# Patient Record
Sex: Male | Born: 1975 | Race: White | Marital: Married | Smoking: Former smoker
Health system: Southern US, Community
[De-identification: ages and names within clinical notes are randomized; demographics above are authoritative.]

## PROBLEM LIST (undated history)

## (undated) DIAGNOSIS — M87051 Idiopathic aseptic necrosis of right femur: Secondary | ICD-10-CM

## (undated) DIAGNOSIS — R51 Headache: Secondary | ICD-10-CM

## (undated) DIAGNOSIS — I10 Essential (primary) hypertension: Secondary | ICD-10-CM

## (undated) DIAGNOSIS — Z87442 Personal history of urinary calculi: Secondary | ICD-10-CM

## (undated) DIAGNOSIS — K219 Gastro-esophageal reflux disease without esophagitis: Secondary | ICD-10-CM

---

## 2010-10-24 ENCOUNTER — Encounter (HOSPITAL_COMMUNITY): Payer: Self-pay

## 2010-10-31 DIAGNOSIS — R079 Chest pain, unspecified: Secondary | ICD-10-CM | POA: Insufficient documentation

## 2010-11-03 ENCOUNTER — Encounter (INDEPENDENT_AMBULATORY_CARE_PROVIDER_SITE_OTHER): Payer: PRIVATE HEALTH INSURANCE | Admitting: Physician Assistant

## 2010-11-03 ENCOUNTER — Encounter: Payer: Self-pay | Admitting: Physician Assistant

## 2010-11-03 DIAGNOSIS — R079 Chest pain, unspecified: Secondary | ICD-10-CM

## 2011-09-04 ENCOUNTER — Ambulatory Visit
Admission: RE | Admit: 2011-09-04 | Discharge: 2011-09-04 | Disposition: A | Discharge status: Home / Self Care | Payer: PRIVATE HEALTH INSURANCE | Source: Ambulatory Visit | Attending: Internal Medicine | Admitting: Internal Medicine

## 2011-09-04 ENCOUNTER — Other Ambulatory Visit: Payer: Self-pay | Admitting: Internal Medicine

## 2011-09-04 DIAGNOSIS — R52 Pain, unspecified: Secondary | ICD-10-CM

## 2012-01-16 NOTE — H&P (Signed)
Raymond Hernandez DOB: 1975-11-14  Chief Complaint: right hip pain  History of Present Illness The patient is a 36 year old male who comes in today for a preoperative History and Physical. The patient is scheduled for a right total hip arthroplasty to be performed by Dr. Georges Lynch. Darrelyn Hillock, MD at [facility] on Monday Feb 05, 2012 . The patient has been seen by Dr. Darrelyn Hillock for his right hip pain since February of 2013. He reports that his symptoms have been present since September 2012. He reports pain in the right groin that radiates to the right knee. He is finding it increasingly difficult to do daily activities as it is extremely painful when weightbearing. MRI showed avascular necrosis of the femoral head of the right hip. Most predictable means for decreased pain and increased function in the right hip is a right total hip arthroplasty. Risks and benefits of the surgery discussed.  PCP: Dr. 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 1994 Hypertension  Allergies No Known Drug Allergies. 11/07/2011   Family History( Heart Disease. father, grandfather mothers side and grandfather fathers side Cerebrovascular Accident. grandmother mothers side Father. living age 22; history of MI, 2 stents Mother. living age 72; hypercholesteremia, HTN, asthma Diabetes Mellitus Type II Alzheimer's disease   Social History Marital status. 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 greater than 5 Exercise. Exercises daily; does running / walking Illicit drug use. no Living situation. live with spouse Tobacco use. smoke(d) 3 or more pack(s) per day; uses less than half 1/2 can(s) smokeless per week Pain Contract. no Previously in rehab. no Tobacco / smoke exposure.  no Drug/Alcohol Rehab (Currently). no Children. 1 Current work status. working full time Alcohol use. current drinker; drinks beer and wine; 5-7 per week Post-Surgical Plans. caregiver after surgery- wife, parents Advance Directives. none   Medication History Lisinopril (40MG  Tablet, Oral daily) Active. ProAir HFA (108 (90 Base)MCG/ACT Aerosol Soln, Inhalation as needed) Active. One-A-Day Mens (1 Oral daily) Active.   Pregnancy / Birth History Pregnant. no   Past Surgical History No pertinent past surgical history    Review of Systems General:Not Present- Chills, Fever, Night Sweats, Appetite Loss, Fatigue, Feeling sick, Weight Gain and Weight Loss. Skin:Not Present- Itching, Rash, Skin Color Changes, Ulcer, Psoriasis and Change in Hair or Nails. HEENT:Not Present- Sensitivity to light, Nose Bleed, Visual Loss, Decreased Hearing and Ringing in the Ears. Neck:Not Present- Swollen Glands and Neck Mass. Respiratory:Present- Shortness of breath with exertion. Not Present- Shortness of breath, Snoring, Chronic Cough and Bloody sputum. Cardiovascular:Not Present- Shortness of Breath, Chest Pain, Swelling of Extremities, Leg Cramps and Palpitations. Gastrointestinal:Not Present- Bloody Stool, Heartburn, Abdominal Pain, Vomiting, Nausea and Incontinence of Stool. Male Genitourinary:Not Present- Blood in Urine, Frequency, Incontinence and Nocturia. Musculoskeletal:Present- Muscle Pain, Joint Stiffness, Joint Swelling and Joint Pain. Not Present- Muscle Weakness and Back Pain. Neurological:Present- Dizziness (due to orthostatic hypotension). Not Present- Tingling, Numbness, Burning, Tremor and Headaches. Psychiatric:Not Present- Anxiety, Depression and Memory Loss. Endocrine:Not Present- Cold Intolerance, Heat Intolerance, Excessive hunger and Excessive Thirst. Hematology:Not Present- Abnormal Bleeding, Abnormal bruising, Anemia and Blood  Clots.   Vitals Weight: 179 lb Height: 69.25 in Body Surface Area: 1.99 m Body Mass Index: 26.24 kg/m Pulse: 98 (Regular) Resp.: 17 (Unlabored) BP: 138/90 (Sitting, Right Arm, Standard)    Physical Exam General Mental Status - Alert, cooperative and  good historian. General Appearance- pleasant. Not in acute distress. Orientation- Oriented X3. Build & Nutrition- Well nourished and Well developed. Head and Neck Head- normocephalic, atraumatic . Neck Global Assessment- supple. no bruit auscultated on the right and no bruit auscultated on the left. Eye Pupil- Bilateral- Normal. Motion- Bilateral- EOMI. Chest and Lung Exam Auscultation: Breath sounds:- clear at anterior chest wall and - clear at posterior chest wall. Adventitious sounds:- No Adventitious sounds. Cardiovascular Auscultation:Rhythm- Regular and Tachycardic. Heart Sounds- S1 WNL and S2 WNL. Murmurs & Other Heart Sounds:Auscultation of the heart reveals - No Murmurs. Abdomen Palpation/Percussion:Tenderness- Abdomen is non-tender to palpation. Rigidity (guarding)- Abdomen is soft. Auscultation:Auscultation of the abdomen reveals - Bowel sounds normal. Male Genitourinary Not done, not pertinent to present illness Peripheral Vascular Upper Extremity: Palpation:- Pulses bilaterally normal. Lower Extremity: Palpation:- Pulses bilaterally normal. Neurologic Examination of related systems reveals - normal muscle strength and tone in all extremities. Neurologic evaluation reveals - normal sensation and upper and lower extremity deep tendon reflexes intact bilaterally . Musculoskeletal His examination shows he has a marked antalgic gait on the right. On examination of his right hip he has a painful limited motion of the hip in all planes, especially with internal rotation. He does have pain with motion. The knee is negative. The opposite left hip is  normal.    RADIOGRAPHS: X-rays of his hip shows an avascular necrosis of the right femoral head. It looks as though he has some separation of the articular cartilage from the femoral head.  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

## 2012-01-23 ENCOUNTER — Encounter (HOSPITAL_COMMUNITY): Payer: Self-pay | Admitting: Pharmacy Technician

## 2012-01-29 ENCOUNTER — Ambulatory Visit (HOSPITAL_COMMUNITY)
Admission: RE | Admit: 2012-01-29 | Discharge: 2012-01-29 | Disposition: A | Discharge status: Home / Self Care | Payer: PRIVATE HEALTH INSURANCE | Source: Ambulatory Visit | Attending: Orthopedic Surgery | Admitting: Orthopedic Surgery

## 2012-01-29 ENCOUNTER — Encounter (HOSPITAL_COMMUNITY): Payer: Self-pay

## 2012-01-29 ENCOUNTER — Encounter (HOSPITAL_COMMUNITY)
Admission: RE | Admit: 2012-01-29 | Discharge: 2012-01-29 | Disposition: A | Discharge status: Home / Self Care | Payer: PRIVATE HEALTH INSURANCE | Source: Ambulatory Visit | Attending: Orthopedic Surgery | Admitting: Orthopedic Surgery

## 2012-01-29 DIAGNOSIS — I4510 Unspecified right bundle-branch block: Secondary | ICD-10-CM | POA: Insufficient documentation

## 2012-01-29 DIAGNOSIS — X58XXXA Exposure to other specified factors, initial encounter: Secondary | ICD-10-CM | POA: Insufficient documentation

## 2012-01-29 DIAGNOSIS — Z01818 Encounter for other preprocedural examination: Secondary | ICD-10-CM | POA: Insufficient documentation

## 2012-01-29 DIAGNOSIS — S32009A Unspecified fracture of unspecified lumbar vertebra, initial encounter for closed fracture: Secondary | ICD-10-CM | POA: Insufficient documentation

## 2012-01-29 DIAGNOSIS — M8708 Idiopathic aseptic necrosis of bone, other site: Secondary | ICD-10-CM | POA: Insufficient documentation

## 2012-01-29 DIAGNOSIS — Z01810 Encounter for preprocedural cardiovascular examination: Secondary | ICD-10-CM | POA: Insufficient documentation

## 2012-01-29 DIAGNOSIS — J984 Other disorders of lung: Secondary | ICD-10-CM | POA: Insufficient documentation

## 2012-01-29 HISTORY — DX: Idiopathic aseptic necrosis of right femur: M87.051

## 2012-01-29 HISTORY — DX: Headache: R51

## 2012-01-29 HISTORY — DX: Essential (primary) hypertension: I10

## 2012-01-29 HISTORY — DX: Personal history of urinary calculi: Z87.442

## 2012-01-29 LAB — CBC
HCT: 44.8 % (ref 39.0–52.0)
Hemoglobin: 15.6 g/dL (ref 13.0–17.0)
MCH: 30.7 pg (ref 26.0–34.0)
MCHC: 34.8 g/dL (ref 30.0–36.0)
MCV: 88.2 fL (ref 78.0–100.0)
Platelets: 291 10*3/uL (ref 150–400)
RBC: 5.08 MIL/uL (ref 4.22–5.81)
RDW: 12.3 % (ref 11.5–15.5)
WBC: 10.1 10*3/uL (ref 4.0–10.5)

## 2012-01-29 LAB — COMPREHENSIVE METABOLIC PANEL
ALT: 22 U/L (ref 0–53)
AST: 17 U/L (ref 0–37)
Albumin: 4.2 g/dL (ref 3.5–5.2)
Alkaline Phosphatase: 69 U/L (ref 39–117)
BUN: 11 mg/dL (ref 6–23)
CO2: 27 mEq/L (ref 19–32)
Calcium: 9.3 mg/dL (ref 8.4–10.5)
Chloride: 100 mEq/L (ref 96–112)
Creatinine, Ser: 0.93 mg/dL (ref 0.50–1.35)
GFR calc Af Amer: >90 mL/min (ref >90)
GFR calc non Af Amer: >90 mL/min (ref >90)
Glucose, Bld: 100 mg/dL — ABNORMAL HIGH (ref 70–99)
Potassium: 3.5 mEq/L (ref 3.5–5.1)
Sodium: 136 mEq/L (ref 135–145)
Total Bilirubin: 0.5 mg/dL (ref 0.3–1.2)
Total Protein: 7.9 g/dL (ref 6.0–8.3)

## 2012-01-29 LAB — DIFFERENTIAL
Basophils Absolute: 0 10*3/uL (ref 0.0–0.1)
Basophils Relative: 0 % (ref 0–1)
Eosinophils Absolute: 0.1 10*3/uL (ref 0.0–0.7)
Eosinophils Relative: 1 % (ref 0–5)
Lymphocytes Relative: 17 % (ref 12–46)
Lymphs Abs: 1.7 10*3/uL (ref 0.7–4.0)
Monocytes Absolute: 0.6 10*3/uL (ref 0.1–1.0)
Monocytes Relative: 6 % (ref 3–12)
Neutro Abs: 7.7 10*3/uL (ref 1.7–7.7)
Neutrophils Relative %: 76 % (ref 43–77)

## 2012-01-29 LAB — URINALYSIS, ROUTINE W REFLEX MICROSCOPIC
Bilirubin Urine: NEGATIVE
Glucose, UA: NEGATIVE mg/dL
Hgb urine dipstick: NEGATIVE
Ketones, ur: NEGATIVE mg/dL
Leukocytes, UA: NEGATIVE
Nitrite: NEGATIVE
Protein, ur: NEGATIVE mg/dL
Specific Gravity, Urine: 1.018 (ref 1.005–1.030)
Urobilinogen, UA: 0.2 mg/dL (ref 0.0–1.0)
pH: 6.5 (ref 5.0–8.0)

## 2012-01-29 LAB — SURGICAL PCR SCREEN
MRSA, PCR: NEGATIVE
Staphylococcus aureus: NEGATIVE

## 2012-01-29 LAB — PROTIME-INR
INR: 0.95 (ref 0.00–1.49)
Prothrombin Time: 12.9 seconds (ref 11.6–15.2)

## 2012-01-29 LAB — APTT: aPTT: 29 seconds (ref 24–37)

## 2012-01-29 NOTE — Patient Instructions (Addendum)
20 Raymond Hernandez  01/29/2012   Your procedure is scheduled on:  02/05/12  Report to SHORT STAY DEPT  at 5:15 AM.  Call this number if you have problems the morning of surgery: [phone]   Remember:   Do not eat food or drink liquids AFTER MIDNIGHT     Take these medicines the morning of surgery with A SIP OF WATER: NONE   Do not wear jewelry, make-up or nail polish.  Do not wear lotions, powders, or perfumes.   Do not shave legs or underarms 48 hrs. before surgery (men may shave face)  Do not bring valuables to the hospital.  Contacts, dentures or bridgework may not be worn into surgery.  Leave suitcase in the car. After surgery it may be brought to your room.  For patients admitted to the hospital, checkout time is 11:00 AM the day of discharge.   Patients discharged the day of surgery will not be allowed to drive home.    Special Instructions:   Please read over the following fact sheets that you were given: MRSA  Information / Incentive Spirometer               SHOWER WITH BETASEPT THE NIGHT BEFORE SURGERY AND THE MORNING OF SURGERY

## 2012-01-29 NOTE — Progress Notes (Signed)
01/29/12 1537  OBSTRUCTIVE SLEEP APNEA  Have you ever been diagnosed with sleep apnea through a sleep study? No  Do you snore loudly (loud enough to be heard through closed doors)?  1  Do you often feel tired, fatigued, or sleepy during the daytime? 1  Has anyone observed you stop breathing during your sleep? 0  Do you have, or are you being treated for high blood pressure? 1  BMI more than 35 kg/m2? 0  Age over 36 years old? 0  Neck circumference greater than 40 cm/18 inches? 0  Gender: 1  Obstructive Sleep Apnea Score 4   Score 4 or greater  Updated health history;Results sent to PCP

## 2012-02-05 ENCOUNTER — Ambulatory Visit (HOSPITAL_COMMUNITY): Payer: PRIVATE HEALTH INSURANCE

## 2012-02-05 ENCOUNTER — Encounter (HOSPITAL_COMMUNITY): Payer: Self-pay | Admitting: Anesthesiology

## 2012-02-05 ENCOUNTER — Encounter (HOSPITAL_COMMUNITY)
Admission: RE | Disposition: A | Discharge status: Home Health | Payer: Self-pay | Source: Ambulatory Visit | Attending: Orthopedic Surgery

## 2012-02-05 ENCOUNTER — Ambulatory Visit (HOSPITAL_COMMUNITY): Payer: PRIVATE HEALTH INSURANCE | Admitting: Anesthesiology

## 2012-02-05 ENCOUNTER — Encounter (HOSPITAL_COMMUNITY): Payer: Self-pay | Admitting: *Deleted

## 2012-02-05 ENCOUNTER — Inpatient Hospital Stay (HOSPITAL_COMMUNITY)
Admission: RE | Admit: 2012-02-05 | Discharge: 2012-02-08 | DRG: 470 | Disposition: A | Discharge status: Home Health | Payer: PRIVATE HEALTH INSURANCE | Source: Ambulatory Visit | Attending: Orthopedic Surgery | Admitting: Orthopedic Surgery

## 2012-02-05 DIAGNOSIS — K219 Gastro-esophageal reflux disease without esophagitis: Secondary | ICD-10-CM | POA: Diagnosis present

## 2012-02-05 DIAGNOSIS — M87059 Idiopathic aseptic necrosis of unspecified femur: Principal | ICD-10-CM | POA: Diagnosis present

## 2012-02-05 DIAGNOSIS — Z8249 Family history of ischemic heart disease and other diseases of the circulatory system: Secondary | ICD-10-CM

## 2012-02-05 DIAGNOSIS — R269 Unspecified abnormalities of gait and mobility: Secondary | ICD-10-CM | POA: Diagnosis present

## 2012-02-05 DIAGNOSIS — I10 Essential (primary) hypertension: Secondary | ICD-10-CM | POA: Diagnosis present

## 2012-02-05 DIAGNOSIS — Z823 Family history of stroke: Secondary | ICD-10-CM

## 2012-02-05 DIAGNOSIS — Z01812 Encounter for preprocedural laboratory examination: Secondary | ICD-10-CM

## 2012-02-05 DIAGNOSIS — Z87442 Personal history of urinary calculi: Secondary | ICD-10-CM

## 2012-02-05 DIAGNOSIS — G4730 Sleep apnea, unspecified: Secondary | ICD-10-CM | POA: Diagnosis present

## 2012-02-05 DIAGNOSIS — J45990 Exercise induced bronchospasm: Secondary | ICD-10-CM | POA: Diagnosis present

## 2012-02-05 DIAGNOSIS — M87051 Idiopathic aseptic necrosis of right femur: Secondary | ICD-10-CM | POA: Diagnosis present

## 2012-02-05 DIAGNOSIS — M8970 Major osseous defect, unspecified site: Secondary | ICD-10-CM | POA: Diagnosis present

## 2012-02-05 HISTORY — PX: TOTAL HIP ARTHROPLASTY: SHX124

## 2012-02-05 LAB — TYPE AND SCREEN
ABO/RH(D): A POS
Antibody Screen: NEGATIVE

## 2012-02-05 SURGERY — ARTHROPLASTY, HIP, TOTAL,POSTERIOR APPROACH
Anesthesia: General | Site: Hip | Laterality: Right | Wound class: Clean

## 2012-02-05 MED ORDER — MIDAZOLAM HCL 5 MG/5ML IJ SOLN
INTRAMUSCULAR | Status: DC | PRN
  Administered 2012-02-05: 2 mg via INTRAVENOUS

## 2012-02-05 MED ORDER — SODIUM CHLORIDE 0.9 % IR SOLN
Status: DC | PRN
  Administered 2012-02-05: 09:00:00

## 2012-02-05 MED ORDER — ONDANSETRON HCL 4 MG PO TABS: 4.0000 mg | ORAL_TABLET | Freq: Four times a day (QID) | ORAL | Status: DC | PRN

## 2012-02-05 MED ORDER — BUPIVACAINE LIPOSOME 1.3 % IJ SUSP
20.0000 mL | Freq: Once | INTRAMUSCULAR | Status: AC
  Administered 2012-02-05: 20 mL
  Filled 2012-02-05: qty 20

## 2012-02-05 MED ORDER — POLYETHYLENE GLYCOL 3350 17 G PO PACK
17.0000 g | PACK | Freq: Every day | ORAL | Status: DC | PRN
  Administered 2012-02-08: 17 g via ORAL

## 2012-02-05 MED ORDER — ACETAMINOPHEN 10 MG/ML IV SOLN
INTRAVENOUS | Status: DC | PRN
  Administered 2012-02-05: 1000 mg via INTRAVENOUS

## 2012-02-05 MED ORDER — MENTHOL 3 MG MT LOZG
1.0000 | LOZENGE | OROMUCOSAL | Status: DC | PRN
  Filled 2012-02-05: qty 9

## 2012-02-05 MED ORDER — ALBUTEROL SULFATE HFA 108 (90 BASE) MCG/ACT IN AERS: 2.0000 | INHALATION_SPRAY | Freq: Four times a day (QID) | RESPIRATORY_TRACT | Status: DC | PRN

## 2012-02-05 MED ORDER — ONDANSETRON HCL 4 MG/2ML IJ SOLN
4.0000 mg | Freq: Four times a day (QID) | INTRAMUSCULAR | Status: DC | PRN
  Filled 2012-02-05: qty 2

## 2012-02-05 MED ORDER — METHOCARBAMOL 500 MG PO TABS
500.0000 mg | ORAL_TABLET | Freq: Four times a day (QID) | ORAL | Status: DC | PRN
  Administered 2012-02-05 – 2012-02-08 (×6): 500 mg via ORAL
  Filled 2012-02-05 (×6): qty 1

## 2012-02-05 MED ORDER — FLEET ENEMA 7-19 GM/118ML RE ENEM: 1.0000 | ENEMA | Freq: Once | RECTAL | Status: AC | PRN

## 2012-02-05 MED ORDER — CEFAZOLIN SODIUM-DEXTROSE 2-3 GM-% IV SOLR
INTRAVENOUS | Status: AC
  Filled 2012-02-05: qty 50

## 2012-02-05 MED ORDER — THROMBIN 5000 UNITS EX SOLR
CUTANEOUS | Status: DC | PRN
  Administered 2012-02-05: 10000 [IU] via TOPICAL

## 2012-02-05 MED ORDER — CEFAZOLIN SODIUM 1-5 GM-% IV SOLN
1.0000 g | Freq: Four times a day (QID) | INTRAVENOUS | Status: AC
  Administered 2012-02-05 – 2012-02-06 (×3): 1 g via INTRAVENOUS
  Filled 2012-02-05 (×3): qty 50

## 2012-02-05 MED ORDER — METHOCARBAMOL 100 MG/ML IJ SOLN
500.0000 mg | Freq: Four times a day (QID) | INTRAVENOUS | Status: DC | PRN
  Administered 2012-02-05 – 2012-02-06 (×3): 500 mg via INTRAVENOUS
  Filled 2012-02-05 (×3): qty 5

## 2012-02-05 MED ORDER — CEFAZOLIN SODIUM 1-5 GM-% IV SOLN
1.0000 g | INTRAVENOUS | Status: AC
  Administered 2012-02-05: 2 g via INTRAVENOUS

## 2012-02-05 MED ORDER — LACTATED RINGERS IV SOLN
INTRAVENOUS | Status: DC
  Administered 2012-02-05 (×2): via INTRAVENOUS

## 2012-02-05 MED ORDER — BACITRACIN-NEOMYCIN-POLYMYXIN 400-5-5000 EX OINT
TOPICAL_OINTMENT | CUTANEOUS | Status: AC
  Filled 2012-02-05: qty 1

## 2012-02-05 MED ORDER — HYDROMORPHONE HCL PF 1 MG/ML IJ SOLN
0.2500 mg | INTRAMUSCULAR | Status: DC | PRN
  Administered 2012-02-05 (×2): 0.5 mg via INTRAVENOUS

## 2012-02-05 MED ORDER — OXYCODONE-ACETAMINOPHEN 5-325 MG PO TABS: 1.0000 | ORAL_TABLET | ORAL | Status: DC | PRN

## 2012-02-05 MED ORDER — ONDANSETRON HCL 4 MG/2ML IJ SOLN
INTRAMUSCULAR | Status: DC | PRN
  Administered 2012-02-05: 4 mg via INTRAVENOUS

## 2012-02-05 MED ORDER — FENTANYL CITRATE 0.05 MG/ML IJ SOLN
INTRAMUSCULAR | Status: DC | PRN
  Administered 2012-02-05 (×2): 50 ug via INTRAVENOUS
  Administered 2012-02-05: 100 ug via INTRAVENOUS
  Administered 2012-02-05: 25 ug via INTRAVENOUS

## 2012-02-05 MED ORDER — ACETAMINOPHEN 325 MG PO TABS: 650.0000 mg | ORAL_TABLET | Freq: Four times a day (QID) | ORAL | Status: DC | PRN

## 2012-02-05 MED ORDER — LISINOPRIL 40 MG PO TABS
40.0000 mg | ORAL_TABLET | Freq: Every day | ORAL | Status: DC
  Filled 2012-02-05: qty 1

## 2012-02-05 MED ORDER — LACTATED RINGERS IV SOLN
INTRAVENOUS | Status: DC
  Administered 2012-02-05 – 2012-02-06 (×4): via INTRAVENOUS

## 2012-02-05 MED ORDER — RIVAROXABAN 10 MG PO TABS
10.0000 mg | ORAL_TABLET | Freq: Every day | ORAL | Status: DC
  Administered 2012-02-06 – 2012-02-08 (×3): 10 mg via ORAL
  Filled 2012-02-05 (×4): qty 1

## 2012-02-05 MED ORDER — HYDROCODONE-ACETAMINOPHEN 5-325 MG PO TABS
1.0000 | ORAL_TABLET | ORAL | Status: DC | PRN
  Administered 2012-02-05 – 2012-02-06 (×4): 2 via ORAL
  Administered 2012-02-06 – 2012-02-08 (×11): 1 via ORAL
  Filled 2012-02-05: qty 1
  Filled 2012-02-05: qty 2
  Filled 2012-02-05 (×6): qty 1
  Filled 2012-02-05 (×2): qty 2
  Filled 2012-02-05: qty 1
  Filled 2012-02-05: qty 2
  Filled 2012-02-05 (×3): qty 1

## 2012-02-05 MED ORDER — BISACODYL 10 MG RE SUPP: 10.0000 mg | Freq: Every day | RECTAL | Status: DC | PRN

## 2012-02-05 MED ORDER — ACETAMINOPHEN 650 MG RE SUPP: 650.0000 mg | Freq: Four times a day (QID) | RECTAL | Status: DC | PRN

## 2012-02-05 MED ORDER — PHENOL 1.4 % MT LIQD: 1.0000 | OROMUCOSAL | Status: DC | PRN

## 2012-02-05 MED ORDER — ALUM & MAG HYDROXIDE-SIMETH 200-200-20 MG/5ML PO SUSP: 30.0000 mL | ORAL | Status: DC | PRN

## 2012-02-05 MED ORDER — LISINOPRIL 40 MG PO TABS
40.0000 mg | ORAL_TABLET | Freq: Every day | ORAL | Status: DC
  Filled 2012-02-05 (×3): qty 1

## 2012-02-05 MED ORDER — FERROUS SULFATE 325 (65 FE) MG PO TABS
325.0000 mg | ORAL_TABLET | Freq: Three times a day (TID) | ORAL | Status: DC
  Administered 2012-02-05 – 2012-02-08 (×5): 325 mg via ORAL
  Filled 2012-02-05 (×12): qty 1

## 2012-02-05 MED ORDER — THROMBIN 5000 UNITS EX SOLR
CUTANEOUS | Status: AC
  Filled 2012-02-05: qty 5000

## 2012-02-05 MED ORDER — PROPOFOL 10 MG/ML IV EMUL
INTRAVENOUS | Status: DC | PRN
  Administered 2012-02-05: 150 mg via INTRAVENOUS

## 2012-02-05 MED ORDER — ROCURONIUM BROMIDE 100 MG/10ML IV SOLN
INTRAVENOUS | Status: DC | PRN
  Administered 2012-02-05: 45 mg via INTRAVENOUS

## 2012-02-05 MED ORDER — HYDROMORPHONE HCL PF 1 MG/ML IJ SOLN
INTRAMUSCULAR | Status: AC
  Filled 2012-02-05: qty 1

## 2012-02-05 MED ORDER — ACETAMINOPHEN 10 MG/ML IV SOLN
INTRAVENOUS | Status: AC
  Filled 2012-02-05: qty 100

## 2012-02-05 MED ORDER — HYDROMORPHONE HCL PF 1 MG/ML IJ SOLN
0.5000 mg | INTRAMUSCULAR | Status: DC | PRN
  Administered 2012-02-05: 1 mg via INTRAVENOUS
  Administered 2012-02-06: 0.5 mg via INTRAVENOUS
  Administered 2012-02-06 (×2): 1 mg via INTRAVENOUS
  Filled 2012-02-05 (×4): qty 1

## 2012-02-05 SURGICAL SUPPLY — 55 items
BAG ZIPLOCK 12X15 (MISCELLANEOUS) ×2 IMPLANT
BLADE SAW SAG 73X25 THK (BLADE) ×1
BLADE SAW SGTL 73X25 THK (BLADE) ×1 IMPLANT
CLOTH BEACON ORANGE TIMEOUT ST (SAFETY) ×2 IMPLANT
DERMABOND ADVANCED (GAUZE/BANDAGES/DRESSINGS) ×1
DERMABOND ADVANCED .7 DNX12 (GAUZE/BANDAGES/DRESSINGS) ×1 IMPLANT
DRAPE INCISE IOBAN 66X45 STRL (DRAPES) ×2 IMPLANT
DRAPE ORTHO SPLIT 77X108 STRL (DRAPES) ×2
DRAPE POUCH INSTRU U-SHP 10X18 (DRAPES) ×2 IMPLANT
DRAPE SURG 17X11 SM STRL (DRAPES) ×2 IMPLANT
DRAPE SURG ORHT 6 SPLT 77X108 (DRAPES) ×2 IMPLANT
DRAPE U-SHAPE 47X51 STRL (DRAPES) ×2 IMPLANT
DRSG AQUACEL AG ADV 3.5X10 (GAUZE/BANDAGES/DRESSINGS) ×2 IMPLANT
DRSG EMULSION OIL 3X16 NADH (GAUZE/BANDAGES/DRESSINGS) ×2 IMPLANT
DRSG PAD ABDOMINAL 8X10 ST (GAUZE/BANDAGES/DRESSINGS) ×4 IMPLANT
DURAPREP 26ML APPLICATOR (WOUND CARE) ×2 IMPLANT
ELECT BLADE TIP CTD 4 INCH (ELECTRODE) ×2 IMPLANT
ELECT REM PT RETURN 9FT ADLT (ELECTROSURGICAL) ×2
ELECTRODE REM PT RTRN 9FT ADLT (ELECTROSURGICAL) ×1 IMPLANT
EVACUATOR 1/8 PVC DRAIN (DRAIN) ×2 IMPLANT
FACESHIELD LNG OPTICON STERILE (SAFETY) ×10 IMPLANT
FLOSEAL 10ML (HEMOSTASIS) IMPLANT
GLOVE BIOGEL M 6.5 STRL (GLOVE) ×2 IMPLANT
GLOVE BIOGEL PI IND STRL 7.5 (GLOVE) ×1 IMPLANT
GLOVE BIOGEL PI IND STRL 8 (GLOVE) ×1 IMPLANT
GLOVE BIOGEL PI IND STRL 8.5 (GLOVE) ×1 IMPLANT
GLOVE BIOGEL PI INDICATOR 7.5 (GLOVE) ×1
GLOVE BIOGEL PI INDICATOR 8 (GLOVE) ×1
GLOVE BIOGEL PI INDICATOR 8.5 (GLOVE) ×1
GLOVE ECLIPSE 8.0 STRL XLNG CF (GLOVE) ×8 IMPLANT
GLOVE ORTHO TXT STRL SZ7.5 (GLOVE) ×2 IMPLANT
GOWN PREVENTION PLUS LG XLONG (DISPOSABLE) ×4 IMPLANT
GOWN STRL REIN XL XLG (GOWN DISPOSABLE) ×6 IMPLANT
IMMOBILIZER KNEE 20 (SOFTGOODS)
IMMOBILIZER KNEE 20 THIGH 36 (SOFTGOODS) IMPLANT
KIT BASIN OR (CUSTOM PROCEDURE TRAY) ×2 IMPLANT
MANIFOLD NEPTUNE II (INSTRUMENTS) ×2 IMPLANT
NEEDLE HYPO 22GX1.5 SAFETY (NEEDLE) ×2 IMPLANT
PACK TOTAL JOINT (CUSTOM PROCEDURE TRAY) ×2 IMPLANT
POSITIONER SURGICAL ARM (MISCELLANEOUS) ×2 IMPLANT
SPONGE LAP 18X18 X RAY DECT (DISPOSABLE) ×2 IMPLANT
SPONGE LAP 4X18 X RAY DECT (DISPOSABLE) ×2 IMPLANT
SPONGE SURGIFOAM ABS GEL 100 (HEMOSTASIS) ×2 IMPLANT
STAPLER VISISTAT 35W (STAPLE) ×2 IMPLANT
SUCTION FRAZIER TIP 10 FR DISP (SUCTIONS) ×2 IMPLANT
SUT VIC AB 0 CT1 27 (SUTURE) ×2
SUT VIC AB 0 CT1 27XBRD ANTBC (SUTURE) ×2 IMPLANT
SUT VIC AB 1 CT1 27 (SUTURE) ×5
SUT VIC AB 1 CT1 27XBRD ANTBC (SUTURE) ×5 IMPLANT
SUT VIC AB 2-0 CT1 27 (SUTURE) ×3
SUT VIC AB 2-0 CT1 TAPERPNT 27 (SUTURE) ×3 IMPLANT
SYR 20CC LL (SYRINGE) ×2 IMPLANT
TOWEL OR 17X26 10 PK STRL BLUE (TOWEL DISPOSABLE) ×4 IMPLANT
TRAY FOLEY CATH 14FRSI W/METER (CATHETERS) ×2 IMPLANT
WATER STERILE IRR 1500ML POUR (IV SOLUTION) ×2 IMPLANT

## 2012-02-05 NOTE — Op Note (Signed)
Raymond Hernandez, Raymond Hernandez               ACCOUNT NO.:  [id number]  MEDICAL RECORD NO.:  [id number]  LOCATION:  [facility]                         FACILITY:  [facility]  PHYSICIAN:  Georges Lynch. Rahmir Beever, M.D.DATE OF BIRTH:  [birth year]  DATE OF PROCEDURE:  02/05/2012 DATE OF DISCHARGE:                              OPERATIVE REPORT   PREOPERATIVE DIAGNOSES: 1. Severe avascular necrosis of the right femoral head with collapse     of the head. 2. 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  This was proven on MRI.  POSTOPERATIVE DIAGNOSES: 1. Severe avascular necrosis of the right femoral head with collapse     of the head. 2. 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  This was proven on MRI.  OPERATION:  Right total hip arthroplasty utilizing DePuy system.  We utilized a Tri-Lock stem, high offset, size 7.  The Pinnacle cup, which was size #52 with 1 screw for fixation.  The insert was an AltrX insert, 36 mm inside diameter.  We did utilize the hole eliminator as well in the cup.  The ball size was an 8.5 ceramic ball, 36 mm diameter.  SURGEON:  Georges Lynch. Darrelyn Hillock, M.D.  ASSISTANT: 1. Madlyn Frankel. Charlann Boxer, M.D. 2. Lanney Gins, PA  DESCRIPTION OF PROCEDURE:  Under general anesthesia, routine orthopedic prep and drape was carried out on the right hip, the left side down, right hip up.  Appropriate time-out was carried out prior to surgery. Also, marked the appropriate right leg in the holding area.  Under general anesthesia as I mentioned, the patient had 2 g of IV Ancef.  At this time, the posterior lateral approach to the hip was carried out on the right,  bleeders identified and cauterized.  I then went down and partially detached the external rotators after we went through the iliotibial band.  Great care was taken not to injure the underlying sciatic nerve.  At this time, I identified the capsule, did a capsulectomy,  dislocated the hip, amputated the femoral head at the appropriate neck length.  I then utilized a box osteotome to remove the cancellous bone from the trochanter.  I then used a widening reamer and then the canal finder was inserted.  I thoroughly irrigated out the femoral canal.  At this time, we then rasped the canal up to a size 7. I did utilize the calcar reamer as well to even out the femoral neck. We then irrigated out the canal again, and I packed the canal with a sponge.  It was later removed,  this was a large sponge.  Following that, we then completed a capsulectomy.  We reamed the acetabulum up to a size 51 for a 52 mm cup.  The 52 mm pinnacle cup was inserted with 1 screw.  We then utilized the hole eliminator as well.  We then checked this cup angled with the Charnley guide.  At this time, we inserted the AltrX cup, 52 mm cup, inside diameter of 36 mm.  We then went through multiple trials for head sizes and finally selected a 0.5 ball length, which  was the most stable and gave [place] the best leg length.  He was short preop, about 0.25 inch or more.  He also has avascular necrosis in the opposite left hip.  We then selected our permanent Tri-Lock stem, size 7, high offset, inserted that and then inserted our permanent ceramic head, 8.5 mm in length, 36 mm diameter.  Note, we did this after we went through multiple trials with different neck lengths.  We reduced the hip after we cleared the acetabulum.  I then thoroughly irrigated out the area, injected the mixture of 20 mL of Exparel with 20 mL of normal saline.  We then reapproximated the soft tissue structures in the usual fashion, closed the skin in the usual fashion and applied the dressing.          ______________________________ Georges Lynch. Darrelyn Hillock, M.D.     RAG/MEDQ  D:  02/05/2012  T:  02/05/2012  Job:  [id number]

## 2012-02-05 NOTE — Anesthesia Postprocedure Evaluation (Signed)
  Anesthesia Post-op Note  Patient: Raymond Hernandez  Procedure(s) Performed: Procedure(s) (LRB): TOTAL HIP ARTHROPLASTY (Right)  Patient Location: PACU  Anesthesia Type: General  Level of Consciousness: oriented and sedated  Airway and Oxygen Therapy: Patient Spontanous Breathing and Patient connected to nasal cannula oxygen  Post-op Pain: mild  Post-op Assessment: Post-op Vital signs reviewed, Patient's Cardiovascular Status Stable, Respiratory Function Stable and Patent Airway  Post-op Vital Signs: stable  Complications: No apparent anesthesia complications

## 2012-02-05 NOTE — Plan of Care (Signed)
Problem: Consults Goal: Diagnosis- Total Joint Replacement Outcome: Completed/Met Date Met:  02/05/12 Primary Total Hip

## 2012-02-05 NOTE — Transfer of Care (Signed)
Immediate Anesthesia Transfer of Care Note  Patient: Raymond Hernandez  Procedure(s) Performed: Procedure(s) (LRB): TOTAL HIP ARTHROPLASTY (Right)  Patient Location: PACU  Anesthesia Type: General  Level of Consciousness: awake, sedated and patient cooperative  Airway & Oxygen Therapy: Patient Spontanous Breathing and Patient connected to face mask oxygen  Post-op Assessment: Report given to PACU RN and Post -op Vital signs reviewed and stable  Post vital signs: Reviewed and stable  Complications: No apparent anesthesia complications

## 2012-02-05 NOTE — Anesthesia Preprocedure Evaluation (Signed)
Anesthesia Evaluation  Patient identified by MRN, date of birth, ID band Patient awake    Reviewed: Allergy & Precautions, H&P , NPO status , Patient's Chart, lab work & pertinent test results, reviewed documented beta blocker date and time   Airway Mallampati: II TM Distance: >3 FB Neck ROM: Full    Dental  (+) Teeth Intact and Dental Advisory Given   Pulmonary asthma ,  breath sounds clear to auscultation        Cardiovascular hypertension, Pt. on medications Rhythm:Regular Rate:Normal  Denies cardiac symptoms   Neuro/Psych negative neurological ROS  negative psych ROS   GI/Hepatic negative GI ROS, Neg liver ROS,   Endo/Other  negative endocrine ROS  Renal/GU negative Renal ROS  negative genitourinary   Musculoskeletal   Abdominal   Peds negative pediatric ROS (+)  Hematology negative hematology ROS (+)   Anesthesia Other Findings   Reproductive/Obstetrics negative OB ROS                           Anesthesia Physical Anesthesia Plan  ASA: II  Anesthesia Plan: General   Post-op Pain Management:    Induction: Intravenous  Airway Management Planned: Oral ETT  Additional Equipment:   Intra-op Plan:   Post-operative Plan: Extubation in OR  Informed Consent:   Dental advisory given  Plan Discussed with: CRNA and Surgeon  Anesthesia Plan Comments:         Anesthesia Quick Evaluation

## 2012-02-05 NOTE — Brief Op Note (Signed)
02/05/2012  9:07 AM  PATIENT:  Gaylyn Cheers  36 y.o. male  PRE-OPERATIVE DIAGNOSIS:  Avascular Necrosis of the Right Hip  POST-OPERATIVE DIAGNOSIS:  Avascular Necrosis of the Right Hip  PROCEDURE:  Procedure(s) (LRB): TOTAL HIP ARTHROPLASTY (Right)  SURGEON:  Surgeon(s) and Role:    * Jacki Cones, MD - Primary    * Shelda Pal, MD - Assisting  PHYSICIAN ASSISTANT: Freddie Breech PA   ASSISTANTS: Kathlee Nations MD}  ANESTHESIA:   general  EBL:  Total I/O In: 1000 [I.V.:1000] Out: 250 [Urine:150; Blood:100]  BLOOD ADMINISTERED:none  DRAINS: none   LOCAL MEDICATIONS USED:  BUPIVICAINE 20cc mixed with 20cc Normal Saline   SPECIMEN:  No Specimen  DISPOSITION OF SPECIMEN:  N/A  COUNTS:  YES  TOURNIQUET:  * No tourniquets in log *  DICTATION: .Other Dictation: Dictation Number [id number]  PLAN OF CARE: Admit to inpatient   PATIENT DISPOSITION:  PACU - hemodynamically stable.   Delay start of Pharmacological VTE agent (>24hrs) due to surgical blood loss or risk of bleeding: yes

## 2012-02-05 NOTE — Interval H&P Note (Signed)
History and Physical Interval Note:  02/05/2012 7:00 AM  Raymond Hernandez  has presented today for surgery, with the diagnosis of Avascular Necrosis of the Right Hip  The various methods of treatment have been discussed with the patient and family. After consideration of risks, benefits and other options for treatment, the patient has consented to  Procedure(s) (LRB): TOTAL HIP ARTHROPLASTY (Right) as a surgical intervention .  The patients' history has been reviewed, patient examined, no change in status, stable for surgery.  I have reviewed the patients' chart and labs.  Questions were answered to the patient's satisfaction.     Ruvi Fullenwider A

## 2012-02-06 LAB — CBC
MCHC: 33.3 g/dL (ref 30.0–36.0)
Platelets: 227 10*3/uL (ref 150–400)
RDW: 12.7 % (ref 11.5–15.5)
WBC: 9.7 10*3/uL (ref 4.0–10.5)

## 2012-02-06 LAB — BASIC METABOLIC PANEL
Chloride: 101 mEq/L (ref 96–112)
GFR calc Af Amer: >90 mL/min (ref >90)
GFR calc non Af Amer: >90 mL/min (ref >90)
Potassium: 4.3 mEq/L (ref 3.5–5.1)
Sodium: 136 mEq/L (ref 135–145)

## 2012-02-06 NOTE — Progress Notes (Signed)
Utilization review completed.  

## 2012-02-06 NOTE — Progress Notes (Signed)
Physical Therapy Treatment Patient Details Name: Raymond Hernandez MRN: [id number] DOB: 02/01/1976 Today's Date: 02/06/2012 Time: 7829-5621 PT Time Calculation (min): 16 min  PT Assessment / Plan / Recommendation Comments on Treatment Session  Pt motivated but limited by discomfort with activity    Follow Up Recommendations  Home health PT    Barriers to Discharge        Equipment Recommendations  Rolling walker with 5" wheels    Recommendations for Other Services OT consult  Frequency 7X/week   Plan Discharge plan remains appropriate    Precautions / Restrictions Precautions Precautions: Posterior Hip Precaution Comments: pt recalls 2/3 THP without cues Restrictions RLE Weight Bearing: Partial weight bearing RLE Partial Weight Bearing Percentage or Pounds: 75%   Pertinent Vitals/Pain 7/10 with initial activity.  Pt premedicated - RN aware    Mobility  Bed Mobility Bed Mobility: Sit to Supine Sit to Supine: 4: Min assist;3: Mod assist Details for Bed Mobility Assistance: cues for sequence and use of UEs and L LE to self assist; Transfers Transfers: Sit to Stand;Stand to Sit Sit to Stand: 4: Min assist;3: Mod assist Stand to Sit: 4: Min assist;3: Mod assist Details for Transfer Assistance: cues for LE management and use of UEs to self assist Ambulation/Gait Ambulation/Gait Assistance: 4: Min assist;3: Mod assist Ambulation Distance (Feet): 20 Feet Assistive device: Rolling walker Ambulation/Gait Assistance Details: cues for sequence, posture, position from RW and ER on R Gait Pattern: Step-to pattern    Exercises     PT Diagnosis:    PT Problem List:   PT Treatment Interventions:     PT Goals Acute Rehab PT Goals PT Goal Formulation: With patient Time For Goal Achievement: 02/06/12 Potential to Achieve Goals: Good Pt will go Supine/Side to Sit: with supervision PT Goal: Supine/Side to Sit - Progress: Goal set today Pt will go Sit to Supine/Side: with  supervision PT Goal: Sit to Supine/Side - Progress: Goal set today Pt will go Sit to Stand: with supervision PT Goal: Sit to Stand - Progress: Goal set today Pt will go Stand to Sit: with supervision PT Goal: Stand to Sit - Progress: Goal set today Pt will Ambulate: 51 - 150 feet;with supervision;with least restrictive assistive device PT Goal: Ambulate - Progress: Progressing toward goal Pt will Go Up / Down Stairs: 3-5 stairs;with min assist;with least restrictive assistive device PT Goal: Up/Down Stairs - Progress: Goal set today  Visit Information  Last PT Received On: 02/06/12 Assistance Needed: +1    Subjective Data  Subjective: I've been sleeping a lot but it still hurts a lot   Cognition  Overall Cognitive Status: Appears within functional limits for tasks assessed/performed Arousal/Alertness: Awake/alert Orientation Level: Appears intact for tasks assessed Behavior During Session: [facility] for tasks performed    Balance     End of Session PT - End of Session Activity Tolerance: Patient tolerated treatment well;Patient limited by pain Patient left: in bed;with call bell/phone within reach;with family/visitor present Nurse Communication: Mobility status    Raymond Hernandez 02/06/2012, 3:23 PM

## 2012-02-06 NOTE — Progress Notes (Signed)
Subjective: Foley is out and pain is moderate in his Right Hip. Will ambulate today.Hbg is stable at 13.0.   Objective: Vital signs in last 24 hours: Temp:  [97 F (36.1 C)-99 F (37.2 C)] 98.6 F (37 C) (05/21 0532) Pulse Rate:  [73-94] 94  (05/21 0532) Resp:  [12-19] 16  (05/21 0532) BP: (99-130)/(67-86) 99/67 mmHg (05/21 0532) SpO2:  [94 %-100 %] 94 % (05/21 0532) FiO2 (%):  [96 %] 96 % (05/20 1115) Weight:  [79.379 kg (175 lb)] 79.379 kg (175 lb) (05/20 1115)  Intake/Output from previous day: 05/20 0701 - 05/21 0700 In: 3680 [P.O.:480; I.V.:3050; IV Piggyback:150] Out: 3175 [Urine:3075; Blood:100] Intake/Output this shift:     Basename 02/06/12 0435  HGB 13.0    Basename 02/06/12 0435  WBC 9.7  RBC 4.28  HCT 39.0  PLT 227    Basename 02/06/12 0435  NA 136  K 4.3  CL 101  CO2 27  BUN 6  CREATININE 0.89  GLUCOSE 108*  CALCIUM 8.4   No results found for this basename: LABPT:2,INR:2 in the last 72 hours  Neurologically intact Dorsiflexion/Plantar flexion intact  Assessment/Plan: DC plans for Thursday.   Raymond Hernandez A 02/06/2012, 7:18 AM

## 2012-02-06 NOTE — Progress Notes (Signed)
CSW consulted for SNF placement. PN reviewed . PT is recommending HHPT. CSW is available to assist with d/c planning if plan changes and SNF is needed. RNCM will assist with d/c planning to home.  Cori Razor  LCSW  [phone]

## 2012-02-06 NOTE — Evaluation (Signed)
Physical Therapy Evaluation Patient Details Name: Raymond Hernandez MRN: [id number] DOB: 03-11-1976 Today's Date: 02/06/2012 Time: 4098-1191 PT Time Calculation (min): 33 min  PT Assessment / Plan / Recommendation Clinical Impression  Pt with R THR presents with decreased R LE strength/ROM and functional mobility limitations    PT Assessment  Patient needs continued PT services    Follow Up Recommendations  Home health PT    Barriers to Discharge        lEquipment Recommendations  Rolling walker with 5" wheels    Recommendations for Other Services OT consult   Frequency 7X/week    Precautions / Restrictions Precautions Precautions: Posterior Hip Precaution Comments: sign hung in room Restrictions Weight Bearing Restrictions: Yes RLE Weight Bearing: Partial weight bearing RLE Partial Weight Bearing Percentage or Pounds: 75%   Pertinent Vitals/Pain 5/10 with activity      Mobility  Bed Mobility Bed Mobility: Supine to Sit Supine to Sit: 3: Mod assist Details for Bed Mobility Assistance: cues for sequence and use of UEs and L LE to self assist; Transfers Transfers: Sit to Stand;Stand to Sit Sit to Stand: 4: Min assist;3: Mod assist Stand to Sit: 4: Min assist;3: Mod assist Details for Transfer Assistance: cues for LE management and use of UEs to self assist Ambulation/Gait Ambulation/Gait Assistance: 4: Min assist;3: Mod assist Ambulation Distance (Feet): 6 Feet Assistive device: Rolling walker Ambulation/Gait Assistance Details: cues for posture, sequence, position from RW, and ER on R Gait Pattern: Step-to pattern    Exercises Total Joint Exercises Ankle Circles/Pumps: AROM;Both;10 reps;Supine Quad Sets: AROM;Both;10 reps;Supine Heel Slides: AAROM;10 reps;Supine;Right Hip ABduction/ADduction: AAROM;10 reps;Right;Supine   PT Diagnosis: Difficulty walking  PT Problem List: Decreased strength;Decreased range of motion;Decreased activity tolerance;Decreased  mobility;Decreased knowledge of use of DME;Decreased knowledge of precautions;Pain PT Treatment Interventions: DME instruction;Gait training;Stair training;Functional mobility training;Therapeutic activities;Therapeutic exercise;Patient/family education   PT Goals Acute Rehab PT Goals PT Goal Formulation: With patient Time For Goal Achievement: 02/06/12 Potential to Achieve Goals: Good Pt will go Supine/Side to Sit: with supervision PT Goal: Supine/Side to Sit - Progress: Goal set today Pt will go Sit to Supine/Side: with supervision PT Goal: Sit to Supine/Side - Progress: Goal set today Pt will go Sit to Stand: with supervision PT Goal: Sit to Stand - Progress: Goal set today Pt will go Stand to Sit: with supervision PT Goal: Stand to Sit - Progress: Goal set today Pt will Ambulate: 51 - 150 feet;with supervision;with least restrictive assistive device PT Goal: Ambulate - Progress: Goal set today Pt will Go Up / Down Stairs: 3-5 stairs;with min assist;with least restrictive assistive device PT Goal: Up/Down Stairs - Progress: Goal set today  Visit Information  Last PT Received On: 02/06/12 Assistance Needed: +1    Subjective Data  Subjective: I've been afraid to move it Patient Stated Goal: Resume previous lifestyle with decreased pain   Prior Functioning  Home Living Lives With: Spouse Available Help at Discharge: Family Type of Home: House Home Access: Stairs to enter Secretary/administrator of Steps: 5 Entrance Stairs-Rails: Right;Left;Can reach both Home Adaptive Equipment: Crutches Prior Function Level of Independence: Independent Able to Take Stairs?: Yes Driving: Yes Communication Communication: No difficulties Dominant Hand: Right    Cognition  Overall Cognitive Status: Appears within functional limits for tasks assessed/performed Arousal/Alertness: Awake/alert Orientation Level: Appears intact for tasks assessed Behavior During Session: [facility] for tasks  performed    Extremity/Trunk Assessment Right Upper Extremity Assessment RUE ROM/Strength/Tone: Within functional levels Left Upper Extremity Assessment LUE ROM/Strength/Tone:  Within functional levels Right Lower Extremity Assessment RLE ROM/Strength/Tone: Deficits RLE ROM/Strength/Tone Deficits: 20* abd, 75* hip flex AAROM at hip; 2+/5 hip strength Left Lower Extremity Assessment LLE ROM/Strength/Tone: [facility] for tasks assessed   Balance    End of Session PT - End of Session Activity Tolerance: Patient tolerated treatment well Patient left: in chair;with call bell/phone within reach;with family/visitor present Nurse Communication: Mobility status   Caliph Borowiak 02/06/2012, 11:27 AM

## 2012-02-07 ENCOUNTER — Encounter (HOSPITAL_COMMUNITY): Payer: Self-pay | Admitting: Orthopedic Surgery

## 2012-02-07 LAB — BASIC METABOLIC PANEL
CO2: 27 mEq/L (ref 19–32)
Calcium: 8.6 mg/dL (ref 8.4–10.5)
Creatinine, Ser: 0.8 mg/dL (ref 0.50–1.35)
Sodium: 133 mEq/L — ABNORMAL LOW (ref 135–145)

## 2012-02-07 LAB — CBC
HCT: 38.5 % — ABNORMAL LOW (ref 39.0–52.0)
Hemoglobin: 13 g/dL (ref 13.0–17.0)
MCHC: 33.8 g/dL (ref 30.0–36.0)
WBC: 11.6 10*3/uL — ABNORMAL HIGH (ref 4.0–10.5)

## 2012-02-07 NOTE — Progress Notes (Signed)
Physical Therapy Treatment Patient Details Name: Raymond Hernandez MRN: [id number] DOB: [birth year] Today's Date: 02/07/2012 Time: 4098-1191 PT Time Calculation (min): 25 min  PT Assessment / Plan / Recommendation Comments on Treatment Session       Follow Up Recommendations  Home health PT    Barriers to Discharge        Equipment Recommendations  Rolling walker with 5" wheels    Recommendations for Other Services OT consult  Frequency 7X/week   Plan Discharge plan remains appropriate    Precautions / Restrictions Precautions Precautions: Posterior Hip Precaution Comments: pt recalls 3/3 THP without cues Restrictions Weight Bearing Restrictions: Yes RLE Weight Bearing: Partial weight bearing RLE Partial Weight Bearing Percentage or Pounds: 75%   Pertinent Vitals/Pain     Mobility  Bed Mobility Bed Mobility: Sit to Supine Sit to Supine: 4: Min assist Details for Bed Mobility Assistance: cues for sequence, use of L LE to self assist and increased time Transfers Transfers: Sit to Stand;Stand to Sit Sit to Stand: 4: Min assist Stand to Sit: 4: Min assist;4: Min guard Details for Transfer Assistance: cues for LE management and use of UEs to self assist Ambulation/Gait Ambulation/Gait Assistance: 4: Min assist;4: Min Government social research officer (Feet): 125 Feet Assistive device: Rolling walker Ambulation/Gait Assistance Details: cues for posture, stride length, position from RW and ER on R Gait Pattern: Step-to pattern    Exercises     PT Diagnosis:    PT Problem List:   PT Treatment Interventions:     PT Goals Acute Rehab PT Goals PT Goal Formulation: With patient Time For Goal Achievement: 02/06/12 Potential to Achieve Goals: Good Pt will go Supine/Side to Sit: with supervision PT Goal: Supine/Side to Sit - Progress: Progressing toward goal Pt will go Sit to Supine/Side: with supervision PT Goal: Sit to Supine/Side - Progress: Progressing toward goal Pt will go  Sit to Stand: with supervision PT Goal: Sit to Stand - Progress: Progressing toward goal Pt will go Stand to Sit: with supervision PT Goal: Stand to Sit - Progress: Progressing toward goal Pt will Ambulate: 51 - 150 feet;with supervision;with least restrictive assistive device PT Goal: Ambulate - Progress: Progressing toward goal  Visit Information  Last PT Received On: 02/07/12 Assistance Needed: +1    Subjective Data  Subjective: I'm doing better this afternoon Patient Stated Goal: Resume previous lifestyle with decreased pain   Cognition  Overall Cognitive Status: Appears within functional limits for tasks assessed/performed Arousal/Alertness: Awake/alert Orientation Level: Appears intact for tasks assessed Behavior During Session: [facility] for tasks performed    Balance     End of Session PT - End of Session Activity Tolerance: Patient tolerated treatment well Patient left: in bed;with call bell/phone within reach;with family/visitor present Nurse Communication: Mobility status    Raymond Hernandez 02/07/2012, 3:28 PM

## 2012-02-07 NOTE — Progress Notes (Signed)
Physical Therapy Treatment Patient Details Name: Raymond Hernandez MRN: [id number] DOB: 1975-12-16 Today's Date: 02/07/2012 Time: 9528-4132 PT Time Calculation (min): 30 min  PT Assessment / Plan / Recommendation Comments on Treatment Session  Pt motivated but limited by discomfort with activity    Follow Up Recommendations  Home health PT    Barriers to Discharge        Equipment Recommendations  Rolling walker with 5" wheels    Recommendations for Other Services OT consult  Frequency 7X/week   Plan Discharge plan remains appropriate    Precautions / Restrictions Precautions Precautions: Posterior Hip Precaution Comments: pt recalls 3/3 THP without cues Restrictions Weight Bearing Restrictions: Yes RLE Weight Bearing: Partial weight bearing RLE Partial Weight Bearing Percentage or Pounds: 75%   Pertinent Vitals/Pain     Mobility  Bed Mobility Bed Mobility: Sit to Supine Supine to Sit: 3: Mod assist;4: Min assist Details for Bed Mobility Assistance: cues for sequence and use of UEs and L LE to self assist; Transfers Transfers: Sit to Stand;Stand to Sit Sit to Stand: 4: Min assist;3: Mod assist Stand to Sit: 4: Min assist Details for Transfer Assistance: cues for LE management and use of UEs to self assist Ambulation/Gait Ambulation/Gait Assistance: 4: Min assist Ambulation Distance (Feet): 68 Feet Assistive device: Rolling walker Ambulation/Gait Assistance Details: cues for posture, stride length, positioni from RW, and ER on R Gait Pattern: Step-to pattern    Exercises Total Joint Exercises Ankle Circles/Pumps: AROM;20 reps;Both;Supine Quad Sets: AROM;Both;10 reps;Supine Gluteal Sets: AROM;Both;10 reps;Supine Heel Slides: AAROM;20 reps;Right;Supine Hip ABduction/ADduction: AAROM;20 reps;Supine;Right   PT Diagnosis:    PT Problem List:   PT Treatment Interventions:     PT Goals Acute Rehab PT Goals PT Goal Formulation: With patient Time For Goal Achievement:  02/06/12 Potential to Achieve Goals: Good Pt will go Supine/Side to Sit: with supervision PT Goal: Supine/Side to Sit - Progress: Progressing toward goal Pt will go Sit to Supine/Side: with supervision PT Goal: Sit to Supine/Side - Progress: Progressing toward goal Pt will go Sit to Stand: with supervision PT Goal: Sit to Stand - Progress: Progressing toward goal Pt will go Stand to Sit: with supervision PT Goal: Stand to Sit - Progress: Progressing toward goal Pt will Ambulate: 51 - 150 feet;with supervision;with least restrictive assistive device PT Goal: Ambulate - Progress: Progressing toward goal  Visit Information  Last PT Received On: 02/07/12 Assistance Needed: +1    Subjective Data  Subjective: It hurts less than yesterday  Patient Stated Goal: Resume previous lifestyle with decreased pain   Cognition  Overall Cognitive Status: Appears within functional limits for tasks assessed/performed Arousal/Alertness: Awake/alert Orientation Level: Appears intact for tasks assessed Behavior During Session: [facility] for tasks performed    Balance     End of Session PT - End of Session Activity Tolerance: Patient tolerated treatment well Patient left: in chair;with call bell/phone within reach;with family/visitor present Nurse Communication: Mobility status    Raymond Hernandez 02/07/2012, 10:56 AM

## 2012-02-07 NOTE — Progress Notes (Signed)
Subjective: 2 Days Post-Op Procedure(s) (LRB): TOTAL HIP ARTHROPLASTY (Right) Patient reports pain as mild.   Patient seen in rounds with Dr. Darrelyn Hillock. Patient is well, but has had some minor complaints of decreased appetite and pain in the right thigh and hip, requiring pain medications. He reports that he has been able to decreased to one Percocet every 4 hours as opposed to two. He reports that he walked with therapy yesterday and felt that he was progressing slower that he would have thought he would. He denies chest pain and shortness of breath. Plan is to go Home after hospital stay.  Objective: Vital signs in last 24 hours: Temp:  [97.9 F (36.6 C)-99.5 F (37.5 C)] 98.9 F (37.2 C) (05/22 0534) Pulse Rate:  [95-99] 98  (05/22 0534) Resp:  [16-20] 16  (05/22 0534) BP: (110-127)/(73-76) 110/76 mmHg (05/22 0534) SpO2:  [93 %-97 %] 96 % (05/22 0534)  Intake/Output from previous day:  Intake/Output Summary (Last 24 hours) at 02/07/12 0811 Last data filed at 02/07/12 0557  Gross per 24 hour  Intake   1080 ml  Output   1925 ml  Net   -845 ml     Labs:  Basename 02/07/12 0415 02/06/12 0435  HGB 13.0 13.0    Basename 02/07/12 0415 02/06/12 0435  WBC 11.6* 9.7  RBC 4.29 4.28  HCT 38.5* 39.0  PLT 233 227    Basename 02/07/12 0415 02/06/12 0435  NA 133* 136  K 3.8 4.3  CL 98 101  CO2 27 27  BUN 5* 6  CREATININE 0.80 0.89  GLUCOSE 122* 108*  CALCIUM 8.6 8.4    EXAM General - Patient is Alert and Oriented Extremity - Neurologically intact Neurovascular intact Intact pulses distally Dorsiflexion/Plantar flexion intact Dressing/Incision - clean, dry, intact Motor Function - intact, moving foot and toes well on exam.   Past Medical History  Diagnosis Date  . Hypertension   . Asthma     EXERCISE INDUCED  . Headache     MIGRAINES - NONE RECDENGT   . Avascular necrosis of hip, right   . Chronic kidney disease   . History of kidney stones     AGE 36  . Sleep  apnea     STOP BANG SCORE - 4    Assessment/Plan: 2 Days Post-Op Procedure(s) (LRB): TOTAL HIP ARTHROPLASTY (Right) Active Problems:  Avascular necrosis of bone of hip, right   Advance diet Up with therapy Plan for discharge tomorrow  Discharge home with home health tomorrow  DVT Prophylaxis - Xarelto Partial-Weight Bearing 25-50% Right Leg  Koreena Joost LAUREN 02/07/2012, 8:11 AM

## 2012-02-08 LAB — CBC
MCH: 30.2 pg (ref 26.0–34.0)
MCV: 89.8 fL (ref 78.0–100.0)
Platelets: 260 10*3/uL (ref 150–400)
RDW: 12.3 % (ref 11.5–15.5)
WBC: 10.9 10*3/uL — ABNORMAL HIGH (ref 4.0–10.5)

## 2012-02-08 MED ORDER — RIVAROXABAN 10 MG PO TABS: 10.0000 mg | ORAL_TABLET | Freq: Every day | ORAL | Status: DC

## 2012-02-08 MED ORDER — METHOCARBAMOL 500 MG PO TABS: 500.0000 mg | ORAL_TABLET | Freq: Four times a day (QID) | ORAL | Status: AC | PRN

## 2012-02-08 MED ORDER — OXYCODONE-ACETAMINOPHEN 5-325 MG PO TABS: 1.0000 | ORAL_TABLET | ORAL | Status: AC | PRN

## 2012-02-08 NOTE — Progress Notes (Signed)
Physical Therapy Treatment Patient Details Name: Raymond Hernandez MRN: [id number] DOB: 09-29-1975 Today's Date: 02/08/2012 Time: 4098-1191 PT Time Calculation (min): 40 min  PT Assessment / Plan / Recommendation Comments on Treatment Session  Marked improvement in activity tolerance    Follow Up Recommendations  Home health PT    Barriers to Discharge        Equipment Recommendations  3 in 1 bedside comode    Recommendations for Other Services OT consult  Frequency 7X/week   Plan Discharge plan remains appropriate    Precautions / Restrictions Precautions Precautions: Posterior Hip Precaution Comments: pt recalls 3/3 THP without cues Restrictions Weight Bearing Restrictions: Yes RLE Weight Bearing: Partial weight bearing RLE Partial Weight Bearing Percentage or Pounds: 50   Pertinent Vitals/Pain 4/10    Mobility  Bed Mobility Bed Mobility: Supine to Sit Supine to Sit: 4: Min guard Details for Bed Mobility Assistance: cues for use of belt to self assist R LE Transfers Transfers: Sit to Stand;Stand to Sit Sit to Stand: 4: Min assist;With upper extremity assist Stand to Sit: 4: Min assist;With upper extremity assist Details for Transfer Assistance: min verbal cues for bringing L LE back under him more before standing Ambulation/Gait Ambulation/Gait Assistance: 4: Min guard;5: Supervision Ambulation Distance (Feet): 160 Feet Assistive device: Rolling walker Ambulation/Gait Assistance Details: min cues for posture and ER on R Gait Pattern: Step-to pattern    Exercises Total Joint Exercises Ankle Circles/Pumps: AROM;20 reps;Both;Supine Gluteal Sets: AROM;Both;10 reps;Supine Short Arc Quad: AROM;AAROM;20 reps;Supine;Right Heel Slides: AAROM;20 reps;Right;Supine Hip ABduction/ADduction: AAROM;20 reps;Supine;Right   PT Diagnosis:    PT Problem List:   PT Treatment Interventions:     PT Goals Acute Rehab PT Goals PT Goal Formulation: With patient Time For Goal  Achievement: 02/13/12 Potential to Achieve Goals: Good Pt will go Supine/Side to Sit: with supervision PT Goal: Supine/Side to Sit - Progress: Progressing toward goal Pt will go Sit to Supine/Side: with supervision PT Goal: Sit to Supine/Side - Progress: Progressing toward goal Pt will go Sit to Stand: with supervision PT Goal: Sit to Stand - Progress: Progressing toward goal Pt will go Stand to Sit: with supervision PT Goal: Stand to Sit - Progress: Progressing toward goal Pt will Ambulate: 51 - 150 feet;with supervision;with least restrictive assistive device PT Goal: Ambulate - Progress: Progressing toward goal  Visit Information  Last PT Received On: 02/08/12 Assistance Needed: +1    Subjective Data  Subjective: I am doing better than yesterday Patient Stated Goal: Resume previous lifestyle with decreased pain   Cognition  Overall Cognitive Status: Appears within functional limits for tasks assessed/performed Arousal/Alertness: Awake/alert Orientation Level: Appears intact for tasks assessed Behavior During Session: [facility] for tasks performed    Balance  Balance Balance Assessed: Yes Dynamic Standing Balance Dynamic Standing - Level of Assistance: 4: Min assist;Other (comment) (holding to RW and alternate hand hold to pull up pants)  End of Session PT - End of Session Activity Tolerance: Patient tolerated treatment well Patient left: in chair;with call bell/phone within reach;with family/visitor present Nurse Communication: Mobility status    Shyloh Krinke 02/08/2012, 11:43 AM

## 2012-02-08 NOTE — Progress Notes (Signed)
Physical Therapy Treatment Patient Details Name: Raymond Hernandez MRN: [id number] DOB: 26-Mar-1976 Today's Date: 02/08/2012 Time: 1236-1300 PT Time Calculation (min): 24 min  PT Assessment / Plan / Recommendation Comments on Treatment Session  Reviewed car transfers with pt and spouse    Follow Up Recommendations  Home health PT    Barriers to Discharge        Equipment Recommendations  3 in 1 bedside comode    Recommendations for Other Services OT consult  Frequency 7X/week   Plan Discharge plan remains appropriate    Precautions / Restrictions Precautions Precautions: Posterior Hip Precaution Comments: pt recalls 3/3 THP without cues Restrictions Weight Bearing Restrictions: Yes RLE Weight Bearing: Partial weight bearing RLE Partial Weight Bearing Percentage or Pounds: 50   Pertinent Vitals/Pain     Mobility  Transfers Transfers: Sit to Stand;Stand to Sit Sit to Stand: 5: Supervision;With armrests;From chair/3-in-1;With upper extremity assist Stand to Sit: 5: Supervision;With upper extremity assist;To chair/3-in-1 Details for Transfer Assistance: min cues for LE management Ambulation/Gait Ambulation/Gait Assistance: 5: Supervision Ambulation Distance (Feet): 100 Feet Assistive device: Rolling walker Ambulation/Gait Assistance Details: min cues for posture and position from RW Gait Pattern: Step-to pattern Stairs: Yes Stairs Assistance: 4: Min assist Stairs Assistance Details (indicate cue type and reason): cues for sequence and for foot/crutch placment Stair Management Technique: Step to pattern Number of Stairs: 4  (4 stairs with crutch and rail and one step bkwd with RW)    Exercises     PT Diagnosis:    PT Problem List:   PT Treatment Interventions:     PT Goals Acute Rehab PT Goals PT Goal Formulation: With patient Time For Goal Achievement: 02/13/12 Potential to Achieve Goals: Good Pt will go Supine/Side to Sit: with supervision PT Goal: Supine/Side to  Sit - Progress: Progressing toward goal Pt will go Sit to Supine/Side: with supervision PT Goal: Sit to Supine/Side - Progress: Progressing toward goal Pt will go Sit to Stand: with supervision PT Goal: Sit to Stand - Progress: Met Pt will go Stand to Sit: with supervision PT Goal: Stand to Sit - Progress: Met Pt will Ambulate: 51 - 150 feet;with supervision;with least restrictive assistive device PT Goal: Ambulate - Progress: Met Pt will Go Up / Down Stairs: 3-5 stairs;with min assist;with least restrictive assistive device PT Goal: Up/Down Stairs - Progress: Met  Visit Information  Last PT Received On: 02/08/12 Assistance Needed: +1    Subjective Data      Cognition  Overall Cognitive Status: Appears within functional limits for tasks assessed/performed Arousal/Alertness: Awake/alert Orientation Level: Appears intact for tasks assessed Behavior During Session: [facility] for tasks performed    Balance  Balance Balance Assessed: Yes Dynamic Standing Balance Dynamic Standing - Level of Assistance: 4: Min assist;Other (comment) (holding to RW and alternate hand hold to pull up pants)  End of Session PT - End of Session Activity Tolerance: Patient tolerated treatment well Patient left: in chair;with call bell/phone within reach;with family/visitor present Nurse Communication: Mobility status    Raymond Hernandez 02/08/2012, 1:19 PM

## 2012-02-08 NOTE — Evaluation (Signed)
Occupational Therapy Evaluation Patient Details Name: Raymond Hernandez MRN: [id number] DOB: August 15, 1976 Today's Date: 02/08/2012 Time: 4098-1191 OT Time Calculation (min): 28 min  OT Assessment / Plan / Recommendation Clinical Impression  pt is s/p posterior R hip and is overall at min assist for functional mobilty and pt plans to have wife assist with LB ADL. All education completed. Pt declined need to practice toilet transfer after simulating with recliner.     OT Assessment  Patient does not need any further OT services    Follow Up Recommendations  No OT follow up    Barriers to Discharge      Equipment Recommendations  3 in 1 bedside comode    Recommendations for Other Services    Frequency       Precautions / Restrictions Precautions Precautions: Posterior Hip Restrictions Weight Bearing Restrictions: Yes RLE Weight Bearing: Partial weight bearing RLE Partial Weight Bearing Percentage or Pounds: 50        ADL  Eating/Feeding: Simulated;Independent Where Assessed - Eating/Feeding: Chair Grooming: Simulated;Wash/dry hands;Set up Where Assessed - Grooming: Supported sitting Upper Body Bathing: Simulated;Chest;Right arm;Left arm;Abdomen;Set up Where Assessed - Upper Body Bathing: Unsupported sitting Lower Body Bathing: Simulated;Moderate assistance;Other (comment) (without adaptive equipment) Where Assessed - Lower Body Bathing: Supported sit to stand Upper Body Dressing: Performed;Set up Where Assessed - Upper Body Dressing: Unsupported sitting Lower Body Dressing: Performed;Maximal assistance;Other (comment) (without adaptive equipment. see below) Where Assessed - Lower Body Dressing: Sopported sit to stand Toilet Transfer: Simulated;Minimal assistance Toilet Transfer Method: Sit to stand Toileting - Clothing Manipulation and Hygiene: Simulated;Minimal assistance Tub/Shower Transfer: Simulated;Minimal assistance Tub/Shower Transfer Method: Other (comment) (step back  over ledge) Equipment Used: Rolling walker;Sock aid;Long-handled shoe horn;Long-handled sponge;Reacher ADL Comments: pt able to state 3/3 hip precautions with min question cues. Demonstrated all AE for pt and wife but pt states he plans to have wife assist with LB ADL. Educated wife and had her practice hands on with how to help with dressing while adhering to THP for pt. Discussed at length placement of 3in1 in shower for most safety as he doesnt think the RW will go through the shower opening. All education completed.     OT Diagnosis:    OT Problem List:   OT Treatment Interventions:     OT Goals    Visit Information  Last OT Received On: 02/08/12 Assistance Needed: +1    Subjective Data  Subjective: better than yesterday Patient Stated Goal: to go home and shower   Prior Functioning  Home Living Lives With: Spouse Available Help at Discharge: Family Type of Home: House Home Access: Stairs to enter Secretary/administrator of Steps: 5 Entrance Stairs-Rails: Right;Left;Can reach both Bathroom Shower/Tub: Health visitor: Standard Home Adaptive Equipment: Crutches Additional Comments: 3in1 delivered to room already Prior Function Level of Independence: Independent Able to Take Stairs?: Yes Driving: Yes Communication Communication: No difficulties Dominant Hand: Right    Cognition  Overall Cognitive Status: Appears within functional limits for tasks assessed/performed Arousal/Alertness: Awake/alert Orientation Level: Appears intact for tasks assessed Behavior During Session: [facility] for tasks performed    Extremity/Trunk Assessment Right Upper Extremity Assessment RUE ROM/Strength/Tone: Within functional levels Left Upper Extremity Assessment LUE ROM/Strength/Tone: Within functional levels   Mobility Transfers Transfers: Sit to Stand;Stand to Sit Sit to Stand: 4: Min assist;With upper extremity assist Stand to Sit: 4: Min assist;With upper extremity  assist Details for Transfer Assistance: min verbal cues for bringing L LE back under him more before standing  Exercise    Balance Balance Balance Assessed: Yes Dynamic Standing Balance Dynamic Standing - Level of Assistance: 4: Min assist;Other (comment) (holding to RW and alternate hand hold to pull up pants)  End of Session OT - End of Session Activity Tolerance: Patient tolerated treatment well Patient left: in chair;with call bell/phone within reach;with family/visitor present   Lennox Laity [phone] 02/08/2012, 11:09 AM

## 2012-02-08 NOTE — Care Management Note (Signed)
    Page 1 of 1   02/08/2012     2:53:30 PM   CARE MANAGEMENT NOTE 02/08/2012  Patient:  Raymond Hernandez, Raymond Hernandez   Account Number:  [id number]  Date Initiated:  02/08/2012  Documentation initiated by:  Colleen Can  Subjective/Objective Assessment:   DX AVASCULAR NECROSIS: TOTAL HIP ARTHROPLASTY     Action/Plan:   CM  spoke with patient and spouse. Plans are for patient to return to his home in [place] where his spouse will be caregiver. Pt already has RW and crutches. HHchoice offered. Pt picked [facility]. List of agencies placed on shadow chart   Anticipated DC Date:  02/08/2012   Anticipated DC Plan:  HOME W HOME HEALTH SERVICES  In-house referral  Clinical Social Worker      DC Planning Services  CM consult      [facility] Choice  HOME HEALTH   Choice offered to / List presented to:  C-1 Patient   DME arranged  NA      DME agency  NA     HH arranged  HH-2 PT      [facility] agency  [facility]   Status of service:  Completed, signed off Medicare Important Message given?  NO (If response is "NO", the following Medicare IM given date fields will be blank) Date Medicare IM given:   Date Additional Medicare IM given:    Discharge Disposition:  HOME W HOME HEALTH SERVICES

## 2012-02-08 NOTE — Progress Notes (Signed)
Discharge summary sent to payer through [facility]  

## 2012-02-08 NOTE — Progress Notes (Signed)
Subjective: 3 Days Post-Op Procedure(s) (LRB): TOTAL HIP ARTHROPLASTY (Right) Patient reports pain as mild.   Patient seen in rounds without Dr. Darrelyn Hillock. Patient is well, but has had some minor complaints of constipation and achy muscle pain in his right side. He is having some achy pain in his right side associated with his positioning in bed and compounded by the fact that he has not had a BM. He denies chest pain and shortness of breath. He has had an improvement in his appetite. Voiding well and positive flatus. Plan is to go Home after hospital stay.  Objective: Vital signs in last 24 hours: Temp:  [98.6 F (37 C)-99 F (37.2 C)] 98.6 F (37 C) (05/23 0640) Pulse Rate:  [76-90] 87  (05/23 0640) Resp:  [16] 16  (05/23 0640) BP: (108-115)/(69-73) 115/73 mmHg (05/23 0640) SpO2:  [97 %] 97 % (05/23 0640)  Intake/Output from previous day:  Intake/Output Summary (Last 24 hours) at 02/08/12 0713 Last data filed at 02/08/12 0640  Gross per 24 hour  Intake   1080 ml  Output   1925 ml  Net   -845 ml     Labs:  Basename 02/08/12 0430 02/07/12 0415 02/06/12 0435  HGB 12.7* 13.0 13.0    Basename 02/08/12 0430 02/07/12 0415  WBC 10.9* 11.6*  RBC 4.21* 4.29  HCT 37.8* 38.5*  PLT 260 233    Basename 02/07/12 0415 02/06/12 0435  NA 133* 136  K 3.8 4.3  CL 98 101  CO2 27 27  BUN 5* 6  CREATININE 0.80 0.89  GLUCOSE 122* 108*  CALCIUM 8.6 8.4    EXAM General - Patient is Alert and Oriented Extremity - Neurologically intact Neurovascular intact Dorsiflexion/Plantar flexion intact Dressing/Incision - dressing C/D/I Motor Function - intact, moving foot and toes well on exam.  Abdomen- soft and nontender. No CVA tenderness  Past Medical History  Diagnosis Date  . Hypertension   . Asthma     EXERCISE INDUCED  . Headache     MIGRAINES - NONE RECDENGT   . Avascular necrosis of hip, right   . Chronic kidney disease   . History of kidney stones     AGE 36  . Sleep  apnea     STOP BANG SCORE - 4    Assessment/Plan: 3 Days Post-Op Procedure(s) (LRB): TOTAL HIP ARTHROPLASTY (Right) Active Problems:  Avascular necrosis of bone of hip, right   Advance diet Up with therapy Discharge home with home health  DVT Prophylaxis - Xarelto Partial-Weight Bearing 50% Right Leg Will meet with OT and PT before discharge  Jahvon Gosline LAUREN 02/08/2012, 7:13 AM

## 2012-02-19 NOTE — Discharge Summary (Signed)
Physician Discharge Summary   Patient ID: Raymond Hernandez MRN: [id number] DOB/AGE: November 03, 1975 35 y.o.  Admit date: 02/05/2012 Discharge date: 02/08/2012  Primary Diagnosis:  Avascular necrosis of right femoral head, right hip   Admission Diagnoses:  Past Medical History  Diagnosis Date  . Hypertension   . Asthma     EXERCISE INDUCED  . Headache     MIGRAINES - NONE RECDENGT   . Avascular necrosis of hip, right   . Chronic kidney disease   . History of kidney stones     AGE 29  . Sleep apnea     STOP BANG SCORE - 4   Discharge Diagnoses:   Active Problems:  Avascular necrosis of bone of hip, right S/P right total hip arthroplasty  Procedure: Procedure(s) (LRB): TOTAL HIP ARTHROPLASTY (Right)   Consults: None  HPI: The patient has been seen by Dr. Darrelyn Hillock for his right hip pain since February of 2013. He reports that his symptoms have been present since September 2012. He reports pain in the right groin that radiates to the right knee. He is finding it increasingly difficult to do daily activities as it is extremely painful when weightbearing. MRI showed avascular necrosis of the femoral head of the right hip. Most predictable means for decreased pain and increased function in the right hip is a right total hip arthroplasty.       Laboratory Data: Hospital Outpatient Visit on 01/29/2012  Component Date Value Range Status  . MRSA, PCR  01/29/2012 NEGATIVE  NEGATIVE Final  . Staphylococcus aureus  01/29/2012 NEGATIVE  NEGATIVE Final   Comment:                                 The Xpert SA Assay (FDA                          approved for NASAL specimens                          only), is one component of                          a comprehensive surveillance                          program.  It is not intended                          to diagnose infection nor to                          guide or monitor treatment.  Marland Kitchen aPTT (seconds) 01/29/2012 29  24-37 Final  . WBC  (K/uL) 01/29/2012 10.1  4.0-10.5 Final  . RBC (MIL/uL) 01/29/2012 5.08  4.22-5.81 Final  . Hemoglobin (g/dL) 78/29/5621 30.8  65.7-84.6 Final  . HCT (%) 01/29/2012 44.8  39.0-52.0 Final  . MCV (fL) 01/29/2012 88.2  78.0-100.0 Final  . MCH (pg) 01/29/2012 30.7  26.0-34.0 Final  . MCHC (g/dL) 96/29/5284 13.2  44.0-10.2 Final  . RDW (%) 01/29/2012 12.3  11.5-15.5 Final  . Platelets (K/uL) 01/29/2012 291  150-400 Final  . Sodium (mEq/L) 01/29/2012 136  135-145 Final  . Potassium (mEq/L) 01/29/2012 3.5  3.5-5.1 Final  .  Chloride (mEq/L) 01/29/2012 100  96-112 Final  . CO2 (mEq/L) 01/29/2012 27  19-32 Final  . Glucose, Bld (mg/dL) 16/06/9603 540* 98-11 Final  . BUN (mg/dL) 91/47/8295 11  6-21 Final  . Creatinine, Ser (mg/dL) 30/86/5784 6.96  2.95-2.84 Final  . Calcium (mg/dL) 13/24/4010 9.3  2.7-25.3 Final  . Total Protein (g/dL) 66/44/0347 7.9  4.2-5.9 Final  . Albumin (g/dL) 56/38/7564 4.2  3.3-2.9 Final  . AST (U/L) 01/29/2012 17  0-37 Final  . ALT (U/L) 01/29/2012 22  0-53 Final  . Alkaline Phosphatase (U/L) 01/29/2012 69  39-117 Final  . Total Bilirubin (mg/dL) 51/88/4166 0.5  0.6-3.0 Final  . GFR calc non Af Amer (mL/min) 01/29/2012 >90  >90 Final  . GFR calc Af Amer (mL/min) 01/29/2012 >90  >90 Final   Comment:                                 The eGFR has been calculated                          using the CKD EPI equation.                          This calculation has not been                          validated in all clinical                          situations.                          eGFR's persistently                          <90 mL/min signify                          possible Chronic Kidney Disease.  Marland Kitchen Neutrophils Relative (%) 01/29/2012 76  43-77 Final  . Neutro Abs (K/uL) 01/29/2012 7.7  1.7-7.7 Final  . Lymphocytes Relative (%) 01/29/2012 17  12-46 Final  . Lymphs Abs (K/uL) 01/29/2012 1.7  0.7-4.0 Final  . Monocytes Relative (%) 01/29/2012 6  3-12 Final  .  Monocytes Absolute (K/uL) 01/29/2012 0.6  0.1-1.0 Final  . Eosinophils Relative (%) 01/29/2012 1  0-5 Final  . Eosinophils Absolute (K/uL) 01/29/2012 0.1  0.0-0.7 Final  . Basophils Relative (%) 01/29/2012 0  0-1 Final  . Basophils Absolute (K/uL) 01/29/2012 0.0  0.0-0.1 Final  . Prothrombin Time (seconds) 01/29/2012 12.9  11.6-15.2 Final  . INR  01/29/2012 0.95  0.00-1.49 Final  . Color, Urine  01/29/2012 YELLOW  YELLOW Final  . APPearance  01/29/2012 CLEAR  CLEAR Final  . Specific Gravity, Urine  01/29/2012 1.018  1.005-1.030 Final  . pH  01/29/2012 6.5  5.0-8.0 Final  . Glucose, UA (mg/dL) 16/09/930 NEGATIVE  NEGATIVE Final  . Hgb urine dipstick  01/29/2012 NEGATIVE  NEGATIVE Final  . Bilirubin Urine  01/29/2012 NEGATIVE  NEGATIVE Final  . Ketones, ur (mg/dL) 35/57/3220 NEGATIVE  NEGATIVE Final  . Protein, ur (mg/dL) 25/42/7062 NEGATIVE  NEGATIVE Final  . Urobilinogen, UA (mg/dL) 37/62/8315 0.2  1.7-6.1 Final  . Nitrite  01/29/2012 NEGATIVE  NEGATIVE Final  . Leukocytes, UA  01/29/2012 NEGATIVE  NEGATIVE Final   MICROSCOPIC NOT DONE ON URINES WITH NEGATIVE PROTEIN, BLOOD, LEUKOCYTES, NITRITE, OR GLUCOSE <1000 mg/dL.    X-Rays:Dg Chest 2 View  01/29/2012  *RADIOLOGY REPORT*  Clinical Data: Preop right total hip  CHEST - 2 VIEW  Comparison: None.  Findings: Heart size and vascularity are normal.  Lungs are clear without infiltrate or effusion.  No mass lesion.  Mild apical scarring bilaterally.  Moderately severe compression fracture approximately L1 with anterior spurring.  This is probably chronic but correlation with symptoms is suggested.  Per CMS PQRS reporting requirements (PQRS Measure 24): Given the patient's age of greater than 50 and the fracture site (hip, distal radius, or spine), the patient should be tested for osteoporosis using DXA, and the appropriate treatment considered based on the DXA results.  IMPRESSION: No acute cardiopulmonary disease.  Original Report Authenticated  By: Camelia Phenes, M.D.   Dg Hip Portable 1 View Right  02/05/2012  *RADIOLOGY REPORT*  Clinical Data: Right total hip arthroplasty.  PORTABLE RIGHT HIP - 1 VIEW  Comparison: None.  Findings: The femoral and acetabular components are well seated. No complicating features.  The visualized bony pelvis is intact.  IMPRESSION: Well seated components of a total right hip arthroplasty without complicating features.  Original Report Authenticated By: P. Loralie Champagne, M.D.    EKG: Orders placed during the hospital encounter of 02/05/12  . EKG     Hospital Course: Patient was admitted to [facility] and taken to the OR and underwent the above state procedure without complications.  Patient tolerated the procedure well and was later transferred to the recovery room and then to the orthopaedic floor for postoperative care.  They were given PO and IV analgesics for pain control following their surgery.  They were given 24 hours of postoperative antibiotics and started on DVT prophylaxis in the form of Xarelto and SCDs.   PT and OT were ordered for total joint protocol.  Discharge planning consulted to help with postop disposition and equipment needs.  Patient had a decent night on the evening of surgery and started to get up OOB with therapy on day one.  Continued to work with therapy into day two.  Patient had very decreased appetite but saw improvement post op day two. By day three, the patient had progressed with therapy and meeting their goals.  He met with OT to discuss ADLs such as dressing himself. Incision was healing well.  Patient was seen in rounds and was ready to go home.  Discharge Medications: Prior to Admission medications   Medication Sig Start Date End Date Taking? Authorizing Provider  albuterol (PROVENTIL HFA;VENTOLIN HFA) 108 (90 BASE) MCG/ACT inhaler Inhale 2 puffs into the lungs every 6 (six) hours as needed. Wheezing    Historical Provider, MD  lisinopril (PRINIVIL,ZESTRIL)  40 MG tablet Take 40 mg by mouth daily.    Historical Provider, MD  methocarbamol (ROBAXIN) 500 MG tablet Take 1 tablet (500 mg total) by mouth every 6 (six) hours as needed. 02/08/12 02/18/12  Cumi Sanagustin Tamala Ser, PA  oxyCODONE-acetaminophen (PERCOCET) 5-325 MG per tablet Take 1-2 tablets by mouth every 4 (four) hours as needed. 02/08/12 02/18/12  Romesha Scherer Tamala Ser, PA  rivaroxaban (XARELTO) 10 MG TABS tablet Take 1 tablet (10 mg total) by mouth daily with breakfast. 02/08/12   Aunika Kirsten Tamala Ser, PA    Diet: Regular diet Activity:PWB 50% right leg No bending hip over  90 degrees- A "L" Angle Do not cross legs Do not let foot roll inward When turning these patients a pillow should be placed between the patient's legs to prevent crossing. Patients should have the affected knee fully extended when trying to sit or stand from all surfaces to prevent excessive hip flexion. When ambulating and turning toward the affected side the affected leg should have the toes turned out prior to moving the walker and the rest of patient's body as to prevent internal rotation/ turning in of the leg. Abduction pillows are the most effective way to prevent a patient from not crossing legs or turning toes in at rest. If an abduction pillow is not ordered placing a regular pillow length wise between the patient's legs is also an effective reminder. It is imperative that these precautions be maintained so that the surgical hip does not dislocate. Follow-up:in 2 weeks Disposition - Home Discharged Condition: good   Discharge Orders    Future Orders Please Complete By Expires   Diet - low sodium heart healthy      Call MD / Call 911      Comments:   If you experience chest pain or shortness of breath, CALL 911 and be transported to the hospital emergency room.  If you develope a fever above 101 F, pus (white drainage) or increased drainage or redness at the wound, or calf pain, call your surgeon's office.    Constipation Prevention      Comments:   Drink plenty of fluids.  Prune juice may be helpful.  You may use a stool softener, such as Colace (over the counter) 100 mg twice a day.  Use MiraLax (over the counter) for constipation as needed.   Increase activity slowly as tolerated      Discharge instructions      Comments:   Walk with your walker. Weight bearing as instructed. Home Health Agency will follow you at home for your therapy  Change your dressing daily starting Monday or Tuesday  Monitor BP at home. If BP less than 130/90 and your are not symptomatic (i.e headaches) you can hold lisinopril Shower only, no tub bath. Call if any temperatures greater than 101 or any wound complications: [phone] during the day and ask for Dr. Jeannetta Ellis nurse, [facility].   Driving restrictions      Comments:   No driving   Lifting restrictions      Comments:   No lifting   Change dressing      Comments:   You may change your dressing daily with sterile 4 x 4 inch gauze dressing and paper tape starting Monday or Tuesday   Follow the hip precautions as taught in Physical Therapy        Medication List  As of 02/19/2012  9:02 AM   STOP taking these medications         acetaminophen 500 MG tablet      mulitivitamin with minerals Tabs         TAKE these medications         albuterol 108 (90 BASE) MCG/ACT inhaler   Commonly known as: PROVENTIL HFA;VENTOLIN HFA   Inhale 2 puffs into the lungs every 6 (six) hours as needed. Wheezing      lisinopril 40 MG tablet   Commonly known as: PRINIVIL,ZESTRIL   Take 40 mg by mouth daily.      rivaroxaban 10 MG Tabs tablet   Commonly known as: XARELTO   Take 1  tablet (10 mg total) by mouth daily with breakfast.             Signed: Euline Kimbler LAUREN 02/19/2012, 9:02 AM

## 2012-07-31 NOTE — H&P (Signed)
TOTAL HIP ADMISSION H&P  Patient is admitted for left total hip arthroplasty.  Subjective:  Chief Complaint: left hip pain  HPI: Raymond Hernandez, 36 y.o. male, has a history of pain and functional disability in the left hip(s) due to avascular necrosis and patient has failed non-surgical conservative treatments for greater than 12 weeks to include use of assistive devices and activity modification.  Onset of symptoms was gradual starting 1 year ago with rapidlly worsening course since that time.The patient noted no past surgery on the left hip(s).  Patient currently rates pain in the left hip at 7 out of 10 with activity. Patient has worsening of pain with activity and weight bearing, pain that interfers with activities of daily living and pain with passive range of motion. Patient has evidence of subchondral sclerosis, joint space narrowing and necrosis of the femoral head by imaging studies. This condition presents safety issues increasing the risk of falls. There is no current active infection.  Patient Active Problem List   Diagnosis Date Noted  . Avascular necrosis of bone of hip, right 02/05/2012  . CHEST PAIN 10/31/2010   Past Medical History  Diagnosis Date  . Hypertension   . Asthma     EXERCISE INDUCED  . Headache     MIGRAINES - NONE RECDENGT   . Avascular necrosis of hip, right   . Chronic kidney disease   . History of kidney stones     AGE 31  . Sleep apnea     STOP BANG SCORE - 4    Past Surgical History  Procedure Date  .    Marland Kitchen Total hip arthroplasty 02/05/2012    Procedure: TOTAL HIP ARTHROPLASTY;  Surgeon: Jacki Cones, MD;  Location: [facility];  Service: Orthopedics;  Laterality: Right;     Current outpatient prescriptions: albuterol (PROVENTIL HFA;VENTOLIN HFA) 108 (90 BASE) MCG/ACT inhaler, Inhale 2 puffs into the lungs every 6 (six) hours as needed. Wheezing,  lisinopril (PRINIVIL,ZESTRIL) 40 MG tablet, Take 40 mg by mouth daily.,    Allergies  Allergen  Reactions  . Bee Venom Anaphylaxis    History  Substance Use Topics  . Smoking status: Current Every Day Smoker -- 0.5 packs/day  . Smokeless tobacco: Not on file  . Alcohol Use: Yes     Comment: 3 BEERS PER DAY   Family History Father living age 41- CAD, cardiac stents Mother living age 77- HTN, hypercholesteremia   Review of Systems  Constitutional: Negative.   HENT: Negative.  Negative for neck pain.   Eyes: Negative.   Respiratory: Positive for shortness of breath and wheezing. Negative for cough, hemoptysis and sputum production.   Cardiovascular: Negative.   Gastrointestinal: Positive for heartburn. Negative for nausea, vomiting, abdominal pain, diarrhea, constipation, blood in stool and melena.  Genitourinary: Negative.   Musculoskeletal: Positive for joint pain. Negative for myalgias, back pain and falls.       Left hip pain  Skin: Negative.   Neurological: Negative.   Endo/Heme/Allergies: Negative.   Psychiatric/Behavioral: Negative.     Objective:  Physical Exam  Constitutional: He is oriented to person, place, and time. He appears well-developed and well-nourished. No distress.  HENT:  Head: Normocephalic and atraumatic.  Right Ear: External ear normal.  Left Ear: External ear normal.  Nose: Nose normal.  Mouth/Throat: Oropharynx is clear and moist.  Eyes: Conjunctivae normal and EOM are normal.  Neck: Normal range of motion. Neck supple. No tracheal deviation present. No thyromegaly present.  Cardiovascular: Normal rate,  regular rhythm, normal heart sounds and intact distal pulses.   No murmur heard. Respiratory: Effort normal. No respiratory distress. He has no wheezes. He exhibits no tenderness.  GI: Soft. Bowel sounds are normal. He exhibits no distension and no mass. There is no tenderness.  Musculoskeletal:       Right hip: Normal.       Left hip: He exhibits decreased range of motion and decreased strength.       Right knee: Normal.       Left knee:  Normal.       Legs: Lymphadenopathy:    He has no cervical adenopathy.  Neurological: He is alert and oriented to person, place, and time. He has normal strength. No sensory deficit.  Skin: No rash noted. He is not diaphoretic. No erythema.  Psychiatric: He has a normal mood and affect.    Vital signs in last 24 hours: BP: 1333/97 HR: 96 RR:16   Estimated Body mass index is 25.84 kg/(m^2) as calculated from the following:   Height as of 01/29/12: 5\' 9" (1.753 m).   Weight as of 01/29/12: 175 lb(79.379 kg).   Imaging Review Plain radiographs demonstrate moderate degenerative joint disease of the left hip(s). The bone quality appears to be fair for age and reported activity level.  Assessment/Plan:  End stage avascular necrosis, left hip  The patient history, physical examination, clinical judgement of the provider and imaging studies are consistent with end stage degenerative joint disease of the left hip(s) and total hip arthroplasty is deemed medically necessary. The treatment options including medical management, injection therapy, arthroscopy and arthroplasty were discussed at length. The risks and benefits of total hip arthroplasty were presented and reviewed. The risks due to aseptic loosening, infection, stiffness, dislocation/subluxation,  thromboembolic complications and other imponderables were discussed.  The patient acknowledged the explanation, agreed to proceed with the plan and consent was signed. Patient is being admitted for inpatient treatment for surgery, pain control, PT, OT, prophylactic antibiotics, VTE prophylaxis, progressive ambulation and ADL's and discharge planning.The patient is planning to be discharged home with home health services   [place], [place]

## 2012-08-05 ENCOUNTER — Encounter (HOSPITAL_COMMUNITY): Payer: Self-pay | Admitting: Pharmacy Technician

## 2012-08-06 ENCOUNTER — Encounter (HOSPITAL_COMMUNITY): Payer: Self-pay

## 2012-08-06 ENCOUNTER — Encounter (HOSPITAL_COMMUNITY)
Admission: RE | Admit: 2012-08-06 | Discharge: 2012-08-06 | Disposition: A | Discharge status: Home / Self Care | Payer: PRIVATE HEALTH INSURANCE | Source: Ambulatory Visit | Attending: Orthopedic Surgery | Admitting: Orthopedic Surgery

## 2012-08-06 HISTORY — DX: Gastro-esophageal reflux disease without esophagitis: K21.9

## 2012-08-06 LAB — PROTIME-INR
INR: 0.88 (ref 0.00–1.49)
Prothrombin Time: 11.9 seconds (ref 11.6–15.2)

## 2012-08-06 LAB — COMPREHENSIVE METABOLIC PANEL
ALT: 26 U/L (ref 0–53)
AST: 18 U/L (ref 0–37)
Albumin: 4 g/dL (ref 3.5–5.2)
Alkaline Phosphatase: 80 U/L (ref 39–117)
BUN: 11 mg/dL (ref 6–23)
CO2: 24 mEq/L (ref 19–32)
Calcium: 9.4 mg/dL (ref 8.4–10.5)
Chloride: 100 mEq/L (ref 96–112)
Creatinine, Ser: 0.91 mg/dL (ref 0.50–1.35)
GFR calc Af Amer: >90 mL/min (ref >90)
GFR calc non Af Amer: >90 mL/min (ref >90)
Glucose, Bld: 86 mg/dL (ref 70–99)
Potassium: 4.2 mEq/L (ref 3.5–5.1)
Sodium: 137 mEq/L (ref 135–145)
Total Bilirubin: 0.3 mg/dL (ref 0.3–1.2)
Total Protein: 7.8 g/dL (ref 6.0–8.3)

## 2012-08-06 LAB — CBC
MCHC: 34.7 g/dL (ref 30.0–36.0)
MCV: 89.1 fL (ref 78.0–100.0)
Platelets: 313 10*3/uL (ref 150–400)
RDW: 12.4 % (ref 11.5–15.5)
WBC: 7.5 10*3/uL (ref 4.0–10.5)

## 2012-08-06 LAB — URINALYSIS, ROUTINE W REFLEX MICROSCOPIC
Bilirubin Urine: NEGATIVE
Glucose, UA: NEGATIVE mg/dL
Hgb urine dipstick: NEGATIVE
Ketones, ur: NEGATIVE mg/dL
Leukocytes, UA: NEGATIVE
Nitrite: NEGATIVE
Protein, ur: NEGATIVE mg/dL
Specific Gravity, Urine: 1.025 (ref 1.005–1.030)
Urobilinogen, UA: 0.2 mg/dL (ref 0.0–1.0)
pH: 6 (ref 5.0–8.0)

## 2012-08-06 LAB — APTT: aPTT: 30 seconds (ref 24–37)

## 2012-08-06 NOTE — Progress Notes (Signed)
08/06/12 0849  OBSTRUCTIVE SLEEP APNEA  Have you ever been diagnosed with sleep apnea through a sleep study? No  Do you snore loudly (loud enough to be heard through closed doors)?  1  Do you often feel tired, fatigued, or sleepy during the daytime? 1  Has anyone observed you stop breathing during your sleep? 0  Do you have, or are you being treated for high blood pressure? 1  BMI more than 35 kg/m2? 0  Age over 36 years old? 0  Neck circumference greater than 40 cm/18 inches? 0  Gender: 1  Obstructive Sleep Apnea Score 4   Score 4 or greater  Results sent to PCP

## 2012-08-06 NOTE — Patient Instructions (Signed)
Ladarrion Telfair  08/06/2012                           YOUR PROCEDURE IS SCHEDULED ON:  08/12/12 AT 2:15 PM               PLEASE REPORT TO SHORT STAY CENTER AT :  11:15 AM               CALL THIS NUMBER IF ANY PROBLEMS THE DAY OF SURGERY :               [phone]                      REMEMBER:   Do not eat food or drink liquids AFTER MIDNIGHT  May have clear liquids UNTIL 6 HOURS BEFORE SURGERY ( 8:15 AM)  Clear liquids include soda, tea, black coffee, apple or grape juice, broth.  Take these medicines the morning of surgery with A SIP OF WATER:  USE ALBUTEROL IF NEDED   Do not wear jewelry, make-up   Do not wear lotions, powders, or perfumes.   Do not shave legs or underarms 12 hrs. before surgery (men may shave face)  Do not bring valuables to the hospital.  Contacts, dentures or bridgework may not be worn into surgery.  Leave suitcase in the car. After surgery it may be brought to your room.  For patients admitted to the hospital more than one night, checkout time is 11:00                          The day of discharge.   Patients discharged the day of surgery will not be allowed to drive home                             If going home same day of surgery, must have someone stay with you first                           24 hrs at home and arrange for some one to drive you home from hospital.    Special Instructions:   Please read over the following fact sheets that you were given:               1. MRSA  INFORMATION                      2. [facility] PREPARING FOR SURGERY SHEET               3. INCENTIVE SPIROMETER                                              X_____________________________________________________________________

## 2012-08-11 MED ORDER — CEFAZOLIN SODIUM-DEXTROSE 2-3 GM-% IV SOLR
2.0000 g | INTRAVENOUS | Status: AC
  Administered 2012-08-12: 2 g via INTRAVENOUS

## 2012-08-12 ENCOUNTER — Inpatient Hospital Stay (HOSPITAL_COMMUNITY): Payer: PRIVATE HEALTH INSURANCE | Admitting: Anesthesiology

## 2012-08-12 ENCOUNTER — Encounter (HOSPITAL_COMMUNITY)
Admission: RE | Disposition: A | Discharge status: Home Health | Payer: Self-pay | Source: Ambulatory Visit | Attending: Orthopedic Surgery

## 2012-08-12 ENCOUNTER — Inpatient Hospital Stay (HOSPITAL_COMMUNITY): Payer: PRIVATE HEALTH INSURANCE

## 2012-08-12 ENCOUNTER — Encounter (HOSPITAL_COMMUNITY): Payer: Self-pay | Admitting: *Deleted

## 2012-08-12 ENCOUNTER — Inpatient Hospital Stay (HOSPITAL_COMMUNITY)
Admission: RE | Admit: 2012-08-12 | Discharge: 2012-08-14 | DRG: 470 | Disposition: A | Discharge status: Home Health | Payer: PRIVATE HEALTH INSURANCE | Source: Ambulatory Visit | Attending: Orthopedic Surgery | Admitting: Orthopedic Surgery

## 2012-08-12 ENCOUNTER — Encounter (HOSPITAL_COMMUNITY): Payer: Self-pay | Admitting: Anesthesiology

## 2012-08-12 DIAGNOSIS — F17200 Nicotine dependence, unspecified, uncomplicated: Secondary | ICD-10-CM | POA: Diagnosis present

## 2012-08-12 DIAGNOSIS — I10 Essential (primary) hypertension: Secondary | ICD-10-CM | POA: Diagnosis present

## 2012-08-12 DIAGNOSIS — Z96649 Presence of unspecified artificial hip joint: Secondary | ICD-10-CM

## 2012-08-12 DIAGNOSIS — M8970 Major osseous defect, unspecified site: Secondary | ICD-10-CM | POA: Diagnosis present

## 2012-08-12 DIAGNOSIS — Z01812 Encounter for preprocedural laboratory examination: Secondary | ICD-10-CM

## 2012-08-12 DIAGNOSIS — M87059 Idiopathic aseptic necrosis of unspecified femur: Principal | ICD-10-CM | POA: Diagnosis present

## 2012-08-12 HISTORY — PX: TOTAL HIP ARTHROPLASTY: SHX124

## 2012-08-12 LAB — TYPE AND SCREEN

## 2012-08-12 SURGERY — ARTHROPLASTY, HIP, TOTAL,POSTERIOR APPROACH
Anesthesia: General | Site: Hip | Laterality: Left | Wound class: Clean

## 2012-08-12 MED ORDER — ALUM & MAG HYDROXIDE-SIMETH 200-200-20 MG/5ML PO SUSP: 30.0000 mL | ORAL | Status: DC | PRN

## 2012-08-12 MED ORDER — RIVAROXABAN 10 MG PO TABS
10.0000 mg | ORAL_TABLET | Freq: Every day | ORAL | Status: DC
  Administered 2012-08-13 – 2012-08-14 (×2): 10 mg via ORAL
  Filled 2012-08-12 (×4): qty 1

## 2012-08-12 MED ORDER — SODIUM CHLORIDE 0.9 % IR SOLN
Status: DC | PRN
  Administered 2012-08-12: 16:00:00

## 2012-08-12 MED ORDER — CISATRACURIUM BESYLATE (PF) 10 MG/5ML IV SOLN
INTRAVENOUS | Status: DC | PRN
  Administered 2012-08-12: 8 mg via INTRAVENOUS

## 2012-08-12 MED ORDER — ONDANSETRON HCL 4 MG/2ML IJ SOLN: 4.0000 mg | Freq: Four times a day (QID) | INTRAMUSCULAR | Status: DC | PRN

## 2012-08-12 MED ORDER — LACTATED RINGERS IV SOLN
INTRAVENOUS | Status: DC
  Administered 2012-08-12 (×2): via INTRAVENOUS
  Administered 2012-08-12: 1000 mL via INTRAVENOUS

## 2012-08-12 MED ORDER — SODIUM CHLORIDE 0.9 % IJ SOLN
INTRAMUSCULAR | Status: DC | PRN
  Administered 2012-08-12: 10 mL

## 2012-08-12 MED ORDER — HEMOSTATIC AGENTS (NO CHARGE) OPTIME
TOPICAL | Status: DC | PRN
  Administered 2012-08-12: 1 via TOPICAL

## 2012-08-12 MED ORDER — ONDANSETRON HCL 4 MG PO TABS: 4.0000 mg | ORAL_TABLET | Freq: Four times a day (QID) | ORAL | Status: DC | PRN

## 2012-08-12 MED ORDER — GLYCOPYRROLATE 0.2 MG/ML IJ SOLN
INTRAMUSCULAR | Status: DC | PRN
  Administered 2012-08-12: .4 mg via INTRAVENOUS

## 2012-08-12 MED ORDER — HYDROMORPHONE HCL PF 1 MG/ML IJ SOLN
0.2500 mg | INTRAMUSCULAR | Status: DC | PRN
  Administered 2012-08-12 (×2): 0.5 mg via INTRAVENOUS

## 2012-08-12 MED ORDER — PROPOFOL 10 MG/ML IV BOLUS
INTRAVENOUS | Status: DC | PRN
  Administered 2012-08-12: 200 mg via INTRAVENOUS

## 2012-08-12 MED ORDER — ACETAMINOPHEN 325 MG PO TABS: 650.0000 mg | ORAL_TABLET | Freq: Four times a day (QID) | ORAL | Status: DC | PRN

## 2012-08-12 MED ORDER — INFLUENZA VIRUS VACC SPLIT PF IM SUSP: 0.5000 mL | INTRAMUSCULAR | Status: DC | PRN

## 2012-08-12 MED ORDER — LISINOPRIL 20 MG PO TABS
20.0000 mg | ORAL_TABLET | Freq: Every day | ORAL | Status: DC
  Administered 2012-08-12: 20 mg via ORAL
  Filled 2012-08-12 (×3): qty 1

## 2012-08-12 MED ORDER — EPINEPHRINE 0.3 MG/0.3ML IJ DEVI
0.3000 mg | Freq: Once | INTRAMUSCULAR | Status: DC
  Filled 2012-08-12: qty 0.6

## 2012-08-12 MED ORDER — HYDROCODONE-ACETAMINOPHEN 5-325 MG PO TABS
1.0000 | ORAL_TABLET | ORAL | Status: DC | PRN
  Administered 2012-08-12 – 2012-08-13 (×3): 2 via ORAL
  Administered 2012-08-13: 1 via ORAL
  Administered 2012-08-13: 2 via ORAL
  Administered 2012-08-13: 1 via ORAL
  Administered 2012-08-14: 2 via ORAL
  Administered 2012-08-14 (×2): 1 via ORAL
  Filled 2012-08-12 (×2): qty 1
  Filled 2012-08-12 (×4): qty 2
  Filled 2012-08-12: qty 1
  Filled 2012-08-12: qty 2
  Filled 2012-08-12: qty 1

## 2012-08-12 MED ORDER — HYDROMORPHONE HCL PF 1 MG/ML IJ SOLN: 1.0000 mg | INTRAMUSCULAR | Status: DC | PRN

## 2012-08-12 MED ORDER — SUFENTANIL CITRATE 50 MCG/ML IV SOLN
INTRAVENOUS | Status: DC | PRN
  Administered 2012-08-12 (×2): 10 ug via INTRAVENOUS
  Administered 2012-08-12: 20 ug via INTRAVENOUS
  Administered 2012-08-12: 10 ug via INTRAVENOUS

## 2012-08-12 MED ORDER — ACETAMINOPHEN 10 MG/ML IV SOLN
INTRAVENOUS | Status: DC | PRN
  Administered 2012-08-12: 1000 mg via INTRAVENOUS

## 2012-08-12 MED ORDER — METHOCARBAMOL 500 MG PO TABS
500.0000 mg | ORAL_TABLET | Freq: Four times a day (QID) | ORAL | Status: DC | PRN
  Administered 2012-08-12 – 2012-08-13 (×3): 500 mg via ORAL
  Filled 2012-08-12 (×3): qty 1

## 2012-08-12 MED ORDER — THROMBIN 5000 UNITS EX SOLR
CUTANEOUS | Status: DC | PRN
  Administered 2012-08-12: 5000 [IU] via TOPICAL

## 2012-08-12 MED ORDER — ALBUTEROL SULFATE HFA 108 (90 BASE) MCG/ACT IN AERS
2.0000 | INHALATION_SPRAY | Freq: Four times a day (QID) | RESPIRATORY_TRACT | Status: DC | PRN
  Filled 2012-08-12: qty 6.7

## 2012-08-12 MED ORDER — PROMETHAZINE HCL 25 MG/ML IJ SOLN: 6.2500 mg | INTRAMUSCULAR | Status: DC | PRN

## 2012-08-12 MED ORDER — ONDANSETRON HCL 4 MG/2ML IJ SOLN
INTRAMUSCULAR | Status: DC | PRN
  Administered 2012-08-12: 4 mg via INTRAVENOUS

## 2012-08-12 MED ORDER — BUPIVACAINE LIPOSOME 1.3 % IJ SUSP
INTRAMUSCULAR | Status: DC | PRN
  Administered 2012-08-12: 20 mL

## 2012-08-12 MED ORDER — NEOSTIGMINE METHYLSULFATE 1 MG/ML IJ SOLN
INTRAMUSCULAR | Status: DC | PRN
  Administered 2012-08-12: 3 mg via INTRAVENOUS

## 2012-08-12 MED ORDER — BUPIVACAINE LIPOSOME 1.3 % IJ SUSP
20.0000 mL | Freq: Once | INTRAMUSCULAR | Status: DC
  Filled 2012-08-12: qty 20

## 2012-08-12 MED ORDER — DEXAMETHASONE SODIUM PHOSPHATE 10 MG/ML IJ SOLN
INTRAMUSCULAR | Status: DC | PRN
  Administered 2012-08-12: 10 mg via INTRAVENOUS

## 2012-08-12 MED ORDER — CHLORHEXIDINE GLUCONATE 4 % EX LIQD: 60.0000 mL | Freq: Once | CUTANEOUS | Status: DC

## 2012-08-12 MED ORDER — METHOCARBAMOL 100 MG/ML IJ SOLN
500.0000 mg | Freq: Four times a day (QID) | INTRAVENOUS | Status: DC | PRN
  Filled 2012-08-12: qty 5

## 2012-08-12 MED ORDER — LACTATED RINGERS IV SOLN
INTRAVENOUS | Status: DC
  Administered 2012-08-13: 02:00:00 via INTRAVENOUS

## 2012-08-12 MED ORDER — SUCCINYLCHOLINE CHLORIDE 20 MG/ML IJ SOLN
INTRAMUSCULAR | Status: DC | PRN
  Administered 2012-08-12: 100 mg via INTRAVENOUS

## 2012-08-12 MED ORDER — KETAMINE HCL 10 MG/ML IJ SOLN
INTRAMUSCULAR | Status: DC | PRN
  Administered 2012-08-12 (×2): 10 mg via INTRAVENOUS
  Administered 2012-08-12: 30 mg via INTRAVENOUS

## 2012-08-12 MED ORDER — MENTHOL 3 MG MT LOZG
1.0000 | LOZENGE | OROMUCOSAL | Status: DC | PRN
  Filled 2012-08-12: qty 9

## 2012-08-12 MED ORDER — PHENOL 1.4 % MT LIQD
1.0000 | OROMUCOSAL | Status: DC | PRN
  Filled 2012-08-12: qty 177

## 2012-08-12 MED ORDER — LIDOCAINE HCL (CARDIAC) 20 MG/ML IV SOLN
INTRAVENOUS | Status: DC | PRN
  Administered 2012-08-12: 100 mg via INTRAVENOUS

## 2012-08-12 MED ORDER — ACETAMINOPHEN 650 MG RE SUPP: 650.0000 mg | Freq: Four times a day (QID) | RECTAL | Status: DC | PRN

## 2012-08-12 MED ORDER — FERROUS SULFATE 325 (65 FE) MG PO TABS
325.0000 mg | ORAL_TABLET | Freq: Three times a day (TID) | ORAL | Status: DC
  Administered 2012-08-13 – 2012-08-14 (×4): 325 mg via ORAL
  Filled 2012-08-12 (×7): qty 1

## 2012-08-12 MED ORDER — MIDAZOLAM HCL 5 MG/5ML IJ SOLN
INTRAMUSCULAR | Status: DC | PRN
  Administered 2012-08-12: 2 mg via INTRAVENOUS

## 2012-08-12 MED ORDER — CEFAZOLIN SODIUM 1-5 GM-% IV SOLN
1.0000 g | Freq: Four times a day (QID) | INTRAVENOUS | Status: AC
  Administered 2012-08-12 – 2012-08-13 (×2): 1 g via INTRAVENOUS
  Filled 2012-08-12 (×2): qty 50

## 2012-08-12 SURGICAL SUPPLY — 63 items
BAG ZIPLOCK 12X15 (MISCELLANEOUS) ×2 IMPLANT
BLADE SAW SAG 73X25 THK (BLADE) ×1
BLADE SAW SGTL 73X25 THK (BLADE) ×1 IMPLANT
CLOTH BEACON ORANGE TIMEOUT ST (SAFETY) ×2 IMPLANT
DERMABOND ADVANCED (GAUZE/BANDAGES/DRESSINGS) ×1
DERMABOND ADVANCED .7 DNX12 (GAUZE/BANDAGES/DRESSINGS) ×1 IMPLANT
DRAPE INCISE IOBAN 66X45 STRL (DRAPES) ×2 IMPLANT
DRAPE INCISE IOBAN 85X60 (DRAPES) ×2 IMPLANT
DRAPE ORTHO SPLIT 77X108 STRL (DRAPES) ×2
DRAPE POUCH INSTRU U-SHP 10X18 (DRAPES) ×2 IMPLANT
DRAPE SURG 17X11 SM STRL (DRAPES) ×2 IMPLANT
DRAPE SURG ORHT 6 SPLT 77X108 (DRAPES) ×2 IMPLANT
DRAPE U-SHAPE 47X51 STRL (DRAPES) ×2 IMPLANT
DRSG AQUACEL AG ADV 3.5X10 (GAUZE/BANDAGES/DRESSINGS) ×2 IMPLANT
DRSG EMULSION OIL 3X16 NADH (GAUZE/BANDAGES/DRESSINGS) ×2 IMPLANT
DRSG PAD ABDOMINAL 8X10 ST (GAUZE/BANDAGES/DRESSINGS) ×4 IMPLANT
DRSG TEGADERM 4X4.75 (GAUZE/BANDAGES/DRESSINGS) ×2 IMPLANT
DURAPREP 26ML APPLICATOR (WOUND CARE) ×2 IMPLANT
ELECT BLADE TIP CTD 4 INCH (ELECTRODE) ×2 IMPLANT
ELECT REM PT RETURN 9FT ADLT (ELECTROSURGICAL) ×4
ELECTRODE REM PT RTRN 9FT ADLT (ELECTROSURGICAL) ×2 IMPLANT
EVACUATOR 1/8 PVC DRAIN (DRAIN) ×2 IMPLANT
EVACUATOR 3/16  PVC DRAIN (DRAIN)
EVACUATOR 3/16 PVC DRAIN (DRAIN) IMPLANT
FACESHIELD LNG OPTICON STERILE (SAFETY) ×10 IMPLANT
FLOSEAL 10ML (HEMOSTASIS) IMPLANT
GAUZE SPONGE 2X2 8PLY STRL LF (GAUZE/BANDAGES/DRESSINGS) ×1 IMPLANT
GLOVE BIOGEL M 6.5 STRL (GLOVE) IMPLANT
GLOVE BIOGEL PI IND STRL 6.5 (GLOVE) ×1 IMPLANT
GLOVE BIOGEL PI IND STRL 7.5 (GLOVE) ×1 IMPLANT
GLOVE BIOGEL PI IND STRL 8 (GLOVE) ×1 IMPLANT
GLOVE BIOGEL PI IND STRL 8.5 (GLOVE) IMPLANT
GLOVE BIOGEL PI INDICATOR 6.5 (GLOVE) ×1
GLOVE BIOGEL PI INDICATOR 7.5 (GLOVE) ×1
GLOVE BIOGEL PI INDICATOR 8 (GLOVE) ×1
GLOVE BIOGEL PI INDICATOR 8.5 (GLOVE)
GLOVE ECLIPSE 8.0 STRL XLNG CF (GLOVE) ×4 IMPLANT
GLOVE ORTHO TXT STRL SZ7.5 (GLOVE) ×2 IMPLANT
GLOVE SURG SS PI 6.5 STRL IVOR (GLOVE) ×6 IMPLANT
GOWN PREVENTION PLUS LG XLONG (DISPOSABLE) ×6 IMPLANT
GOWN STRL REIN XL XLG (GOWN DISPOSABLE) ×4 IMPLANT
IMMOBILIZER KNEE 20 (SOFTGOODS) ×2
IMMOBILIZER KNEE 20 THIGH 36 (SOFTGOODS) ×1 IMPLANT
KIT BASIN OR (CUSTOM PROCEDURE TRAY) ×2 IMPLANT
MANIFOLD NEPTUNE II (INSTRUMENTS) ×2 IMPLANT
NEEDLE HYPO 22GX1.5 SAFETY (NEEDLE) ×2 IMPLANT
PACK TOTAL JOINT (CUSTOM PROCEDURE TRAY) ×2 IMPLANT
POSITIONER SURGICAL ARM (MISCELLANEOUS) ×2 IMPLANT
SPONGE GAUZE 2X2 STER 10/PKG (GAUZE/BANDAGES/DRESSINGS) ×1
SPONGE LAP 18X18 X RAY DECT (DISPOSABLE) IMPLANT
SPONGE LAP 4X18 X RAY DECT (DISPOSABLE) IMPLANT
SPONGE SURGIFOAM ABS GEL 100 (HEMOSTASIS) ×2 IMPLANT
STAPLER VISISTAT 35W (STAPLE) ×2 IMPLANT
SUCTION FRAZIER 12FR DISP (SUCTIONS) ×2 IMPLANT
SUCTION FRAZIER TIP 10 FR DISP (SUCTIONS) ×2 IMPLANT
SUT VIC AB 0 CT1 27 (SUTURE) ×2
SUT VIC AB 0 CT1 27XBRD ANTBC (SUTURE) ×2 IMPLANT
SUT VIC AB 1 CT1 27 (SUTURE) ×5
SUT VIC AB 1 CT1 27XBRD ANTBC (SUTURE) ×5 IMPLANT
SYR 20CC LL (SYRINGE) ×2 IMPLANT
TOWEL OR 17X26 10 PK STRL BLUE (TOWEL DISPOSABLE) ×4 IMPLANT
TRAY FOLEY CATH 14FRSI W/METER (CATHETERS) ×2 IMPLANT
WATER STERILE IRR 1500ML POUR (IV SOLUTION) ×4 IMPLANT

## 2012-08-12 NOTE — Anesthesia Preprocedure Evaluation (Signed)
Anesthesia Evaluation  Patient identified by MRN, date of birth, ID band Patient awake    Reviewed: Allergy & Precautions, H&P , NPO status , Patient's Chart, lab work & pertinent test results  Airway Mallampati: II TM Distance: >3 FB Neck ROM: Full    Dental No notable dental hx.    Pulmonary asthma , Current Smoker,  breath sounds clear to auscultation  Pulmonary exam normal       Cardiovascular hypertension, Pt. on medications Rhythm:Regular Rate:Normal     Neuro/Psych negative neurological ROS  negative psych ROS   GI/Hepatic negative GI ROS, Neg liver ROS,   Endo/Other  negative endocrine ROS  Renal/GU negative Renal ROS  negative genitourinary   Musculoskeletal negative musculoskeletal ROS (+)   Abdominal   Peds negative pediatric ROS (+)  Hematology negative hematology ROS (+)   Anesthesia Other Findings   Reproductive/Obstetrics negative OB ROS                           Anesthesia Physical Anesthesia Plan  ASA: II  Anesthesia Plan: General   Post-op Pain Management:    Induction: Intravenous  Airway Management Planned: Oral ETT  Additional Equipment:   Intra-op Plan:   Post-operative Plan: Extubation in OR  Informed Consent: I have reviewed the patients History and Physical, chart, labs and discussed the procedure including the risks, benefits and alternatives for the proposed anesthesia with the patient or authorized representative who has indicated his/her understanding and acceptance.   Dental advisory given  Plan Discussed with: CRNA and Surgeon  Anesthesia Plan Comments:         Anesthesia Quick Evaluation

## 2012-08-12 NOTE — Transfer of Care (Signed)
Immediate Anesthesia Transfer of Care Note  Patient: Raymond Hernandez  Procedure(s) Performed: Procedure(s) (LRB) with comments: TOTAL HIP ARTHROPLASTY (Left)  Patient Location: PACU  Anesthesia Type:General  Level of Consciousness: awake, alert  and oriented  Airway & Oxygen Therapy: Patient Spontanous Breathing and Patient connected to face mask oxygen  Post-op Assessment: Report given to PACU RN and Post -op Vital signs reviewed and stable  Post vital signs: Reviewed and stable  Complications: No apparent anesthesia complications

## 2012-08-12 NOTE — Preoperative (Signed)
Beta Blockers   Reason not to administer Beta Blockers:Not Applicable 

## 2012-08-12 NOTE — Brief Op Note (Signed)
08/12/2012  4:52 PM  PATIENT:  Gaylyn Cheers  36 y.o. male  PRE-OPERATIVE DIAGNOSIS:  Avascular neucrosis of the left hip  POST-OPERATIVE DIAGNOSIS:  Avascular neucrosis of the left hip  PROCEDURE:  Procedure(s) (LRB) with comments: TOTAL HIP ARTHROPLASTY (Left)  SURGEON:  Surgeon(s) and Role:    * Jacki Cones, MD - Primary    * Shelda Pal, MD - Assisting  PHYSICIAN ASSISTANT: Dimitri Ped PA    ANESTHESIA:   general  EBL:  Total I/O In: 1000 [I.V.:1000] Out: 300 [Urine:150; Blood:150]  BLOOD ADMINISTERED:none  DRAINS: (one) Hemovact drain(s) in the Left with  Suction Open   LOCAL MEDICATIONS USED:  BUPIVICAINE   SPECIMEN:  No Specimen  DISPOSITION OF SPECIMEN:  N/A  COUNTS:  YES  TOURNIQUET:  * No tourniquets in log *  DICTATION: .Other Dictation: Dictation Number [id number]  PLAN OF CARE: Admit to inpatient   PATIENT DISPOSITION:  Stable in OR   Delay start of Pharmacological VTE agent (>24hrs) due to surgical blood loss or risk of bleeding: yes

## 2012-08-12 NOTE — Anesthesia Postprocedure Evaluation (Signed)
  Anesthesia Post-op Note  Patient: Raymond Hernandez  Procedure(s) Performed: Procedure(s) (LRB): TOTAL HIP ARTHROPLASTY (Left)  Patient Location: PACU  Anesthesia Type: General  Level of Consciousness: awake and alert   Airway and Oxygen Therapy: Patient Spontanous Breathing  Post-op Pain: mild  Post-op Assessment: Post-op Vital signs reviewed, Patient's Cardiovascular Status Stable, Respiratory Function Stable, Patent Airway and No signs of Nausea or vomiting  Last Vitals:  Filed Vitals:   08/12/12 1715  BP: 134/85  Pulse: 89  Temp: 36.6 C  Resp: 14    Post-op Vital Signs: stable   Complications: No apparent anesthesia complications

## 2012-08-12 NOTE — Anesthesia Procedure Notes (Signed)
Procedure Name: Intubation Date/Time: 08/12/2012 3:29 PM Performed by: Leroy Libman L Patient Re-evaluated:Patient Re-evaluated prior to inductionOxygen Delivery Method: Circle system utilized Preoxygenation: Pre-oxygenation with 100% oxygen Intubation Type: IV induction Ventilation: Mask ventilation without difficulty and Oral airway inserted - appropriate to patient size Laryngoscope Size: Miller and 3 Grade View: Grade I Tube type: Oral Tube size: 8.0 mm Number of attempts: 1 Airway Equipment and Method: Stylet Placement Confirmation: ETT inserted through vocal cords under direct vision,  breath sounds checked- equal and bilateral and positive ETCO2 Secured at: 22 cm Tube secured with: Tape Dental Injury: Teeth and Oropharynx as per pre-operative assessment

## 2012-08-12 NOTE — Interval H&P Note (Signed)
History and Physical Interval Note:  08/12/2012 1:55 PM  Raymond Hernandez  has presented today for surgery, with the diagnosis of Avascular neucrosis of the left hip  The various methods of treatment have been discussed with the patient and family. After consideration of risks, benefits and other options for treatment, the patient has consented to  Procedure(s) (LRB) with comments: TOTAL HIP ARTHROPLASTY (Left) as a surgical intervention .  The patient's history has been reviewed, patient examined, no change in status, stable for surgery.  I have reviewed the patient's chart and labs.  Questions were answered to the patient's satisfaction.     Jacqulin Brandenburger A

## 2012-08-13 ENCOUNTER — Encounter (HOSPITAL_COMMUNITY): Payer: Self-pay | Admitting: Orthopedic Surgery

## 2012-08-13 LAB — BASIC METABOLIC PANEL
Chloride: 98 mEq/L (ref 96–112)
GFR calc Af Amer: >90 mL/min (ref >90)
GFR calc non Af Amer: >90 mL/min (ref >90)
Potassium: 4.2 mEq/L (ref 3.5–5.1)
Sodium: 134 mEq/L — ABNORMAL LOW (ref 135–145)

## 2012-08-13 LAB — CBC
HCT: 38.1 % — ABNORMAL LOW (ref 39.0–52.0)
MCHC: 34.6 g/dL (ref 30.0–36.0)
RDW: 12.3 % (ref 11.5–15.5)
WBC: 13.5 10*3/uL — ABNORMAL HIGH (ref 4.0–10.5)

## 2012-08-13 NOTE — Progress Notes (Signed)
Utilization review completed.  

## 2012-08-13 NOTE — Op Note (Signed)
NAMEADEMOLA, VERT               ACCOUNT NO.:  [id number]  MEDICAL RECORD NO.:  [id number]  LOCATION:  1612                         FACILITY:  [facility]  PHYSICIAN:  Georges Lynch. Trip Cavanagh, M.D.DATE OF BIRTH:  27-Sep-1975  DATE OF PROCEDURE:  08/12/2012 DATE OF DISCHARGE:                              OPERATIVE REPORT   SURGEON:  Georges Lynch. Darrelyn Hillock, M.D.  ASSISTANT:  Madlyn Frankel. Charlann Boxer, M.D. and [place], [place]  PREOPERATIVE DIAGNOSIS:  Avascular necrosis, left hip.  POSTOPERATIVE DIAGNOSIS:  Avascular necrosis, left hip.  OPERATION:  Left total hip arthroplasty utilizing DePuy system.  The sizes used was a size high offset size 7 Tri-Lock stem.  The cup was a 52-mm Pinnacle cup with 1 screw, and the hole eliminator.  The insert was a polyethylene Altrex 36-mm inside diameter.  The ball was a ceramic ball, +5.  DESCRIPTION OF PROCEDURE:  Under general anesthesia. The patient on the right side, left side up routine orthopedic prep and draping of the left hip was carried out.  The appropriate time-out was carried out prior to making incision.  I also marked the appropriate left lower extremity in the holding area.  The patient had 2 g of IV Ancef.  A posterior lateral approach to the hip was carried out.  Bleeders were identified and cauterized.  Self-retaining retractors were inserted.  I then went down, identified the iliotibial band.  I incised the iliotibial band.  I partially detached the external rotators and separated the gluteal muscle by blunt dissection.  I then went down and did a capsulectomy.  I dislocated the head appropriate, and then I cut the neck length at the appropriate length.  I then utilized the box osteotome to remove the cancellous bone from the trochanter.  I then used the widening reamer followed by the canal finder.  I thoroughly irrigated out the canal during the procedure.  We then rasped the canal up to a size 7 Tri-Lock stem.  Next, attention was directed  to the acetabulum.  I reamed the acetabulum up to 51 mm for a 52 mm cup.  Following that, I inserted a permanent Pinnacle 52-mm cup.  Hole eliminator was inserted.  We also inserted 1 screw, 30 mm in length for fixation of the cup.  Following that, the permanent Altrex 36 mm inside diameter cup was inserted.  We then went through trials and selected a +5 ball which was more stable and equalized leg length to the other total hip.  Following that, we then removed the trial components and inserted our permanent Tri-Lock stem size 7 high offset, after we irrigated the canal.  At that time, I then went through trials again, we selected a +5 ceramic head, 36 mm diameter.  After this, we then cleared the acetabulum, reduced the hip. The hip was stable.  Then, we went to leg length measurements again and the leg lengths were equal.  I thoroughly irrigated out the area and closed the wound layers over Hemovac drain.  I injected a mixture of 20 mL of Exparel with 10 mL of normal saline.  The remaining part of the wound was closed in the usual fashion.  We  used Monocryl closure and subcutaneous  closure for the skin.  Sterile dressings were applied.          ______________________________ Georges Lynch Darrelyn Hillock, M.D.     RAG/MEDQ  D:  08/12/2012  T:  08/13/2012  Job:  [id number]

## 2012-08-13 NOTE — Evaluation (Signed)
Physical Therapy Evaluation Patient Details Name: Raymond Hernandez MRN: [id number] DOB: Nov 11, 1975 Today's Date: 08/13/2012 Time: 4098-1191 PT Time Calculation (min): 18 min  PT Assessment / Plan / Recommendation Clinical Impression  Pt s/p R THR with posterior hip precautions and PWB R LE.  Pt would benefit from acute PT services in order to improve independence with transfers, ambulation and stairs for safe d/c home with spouse.    PT Assessment  Patient needs continued PT services    Follow Up Recommendations  Home health PT    Does the patient have the potential to tolerate intense rehabilitation      Barriers to Discharge        Equipment Recommendations  None recommended by PT    Recommendations for Other Services     Frequency 7X/week    Precautions / Restrictions Precautions Precautions: Posterior Hip Precaution Comments: pt only able to recall 1/3 precautions from previous R THR so reviewed THP and posted handout in room Restrictions Weight Bearing Restrictions: Yes LLE Weight Bearing: Partial weight bearing LLE Partial Weight Bearing Percentage or Pounds: <50%   Pertinent Vitals/Pain Pt reports premedicated and no pain at rest, did report some pain with ambulation but not rated, repositioned      Mobility  Bed Mobility Bed Mobility: Supine to Sit Supine to Sit: 4: Min assist;HOB elevated Details for Bed Mobility Assistance: assist for L LE, verbal cues for technique within precautions Transfers Transfers: Stand to Sit;Sit to Stand Sit to Stand: 4: Min guard;With upper extremity assist;From bed Stand to Sit: 4: Min guard;With upper extremity assist;To chair/3-in-1 Details for Transfer Assistance: verbal cues for safe technique within precautions Ambulation/Gait Ambulation/Gait Assistance: 4: Min guard;5: Supervision Ambulation Distance (Feet): 60 Feet Assistive device: Rolling walker Ambulation/Gait Assistance Details: verbal cue for sequence, PWB Gait  Pattern: Step-to pattern;Antalgic Gait velocity: decreased    Shoulder Instructions     Exercises     PT Diagnosis: Difficulty walking;Acute pain  PT Problem List: Decreased strength;Decreased activity tolerance;Decreased mobility;Decreased knowledge of precautions;Pain;Decreased knowledge of use of DME PT Treatment Interventions: Functional mobility training;Stair training;Gait training;DME instruction;Patient/family education;Therapeutic exercise;Therapeutic activities   PT Goals Acute Rehab PT Goals PT Goal Formulation: With patient Time For Goal Achievement: 08/20/12 Potential to Achieve Goals: Good Pt will go Supine/Side to Sit: with modified independence PT Goal: Supine/Side to Sit - Progress: Goal set today Pt will go Sit to Supine/Side: with modified independence PT Goal: Sit to Supine/Side - Progress: Goal set today Pt will go Sit to Stand: with modified independence PT Goal: Sit to Stand - Progress: Goal set today Pt will go Stand to Sit: with modified independence PT Goal: Stand to Sit - Progress: Goal set today Pt will Ambulate: 51 - 150 feet;with modified independence;with least restrictive assistive device PT Goal: Ambulate - Progress: Goal set today Pt will Go Up / Down Stairs: 3-5 stairs;with supervision;with least restrictive assistive device;with rail(s) PT Goal: Up/Down Stairs - Progress: Goal set today Pt will Perform Home Exercise Program: with supervision, verbal cues required/provided PT Goal: Perform Home Exercise Program - Progress: Goal set today  Visit Information  Last PT Received On: 08/13/12 Assistance Needed: +1    Subjective Data  Subjective: This is much better than last time (previous R THR)   Prior Functioning  Home Living Lives With: Spouse Type of Home: House Home Access: Stairs to enter [employer] of Steps: 5-6 Entrance Stairs-Rails: Right Home Layout: One level Home Adaptive Equipment: Bedside commode/3-in-1;Walker -  rolling;Crutches Prior Function  Level of Independence: Independent Communication Communication: No difficulties    Cognition  Overall Cognitive Status: Appears within functional limits for tasks assessed/performed Arousal/Alertness: Awake/alert Orientation Level: Appears intact for tasks assessed Behavior During Session: [facility] for tasks performed    Extremity/Trunk Assessment Right Upper Extremity Assessment RUE ROM/Strength/Tone: [facility] for tasks assessed Left Upper Extremity Assessment LUE ROM/Strength/Tone: WFL for tasks assessed Right Lower Extremity Assessment RLE ROM/Strength/Tone: [facility] for tasks assessed Left Lower Extremity Assessment LLE ROM/Strength/Tone: Deficits LLE ROM/Strength/Tone Deficits: decreased hip strength against gravity observed with mobility   Balance    End of Session PT - End of Session Activity Tolerance: Patient tolerated treatment well Patient left: in chair;with call bell/phone within reach;with family/visitor present  GP     Antar Milks,KATHrine E 08/13/2012, 10:54 AM Pager: [phone]

## 2012-08-13 NOTE — Care Management Note (Addendum)
    Page 1 of 2   08/14/2012     2:48:50 PM   CARE MANAGEMENT NOTE 08/14/2012  Patient:  Raymond Hernandez, Raymond Hernandez   Account Number:  [id number]  Date Initiated:  08/13/2012  Documentation initiated by:  Colleen Can  Subjective/Objective Assessment:   dx avascular necrosis left hip; total hip replacemnt     Action/Plan:   CM spoke with patient and parents. Plans are for patient to retun to his home in [place] where spouse will be caregiver. He already has RW, crutches, and 3N1. Wants gentiva if needed   Anticipated DC Date:  08/15/2012   Anticipated DC Plan:  HOME W HOME HEALTH SERVICES  In-house referral  NA      DC Planning Services  CM consult      [facility] Choice  HOME HEALTH   Choice offered to / List presented to:  C-1 Patient   DME arranged  NA      DME agency  NA     HH arranged  HH-2 PT      [facility] agency  [facility]   Status of service:  Completed, signed off Medicare Important Message given?  NO (If response is "NO", the following Medicare IM given date fields will be blank) Date Medicare IM given:   Date Additional Medicare IM given:    Discharge Disposition:  HOME W HOME HEALTH SERVICES  Per UR Regulation:    If discussed at Long Length of Stay Meetings, dates discussed:    Comments:  08/14/2012 Colleen Can BSN RN CCM [phone] Genevieve Norlander will provide [facility] services with start date 08/15/2012. Pt for discharge today.

## 2012-08-13 NOTE — Progress Notes (Signed)
Chart reviewed, and spoke with pt and wife.  No OT needs identified.  Pt. Has all DME and AE needs and are familiar with adaptive techniques for LB ADLs from pt's previous THA.  Will sign off.  Jeani Hawking, OTR/L [phone]

## 2012-08-13 NOTE — Plan of Care (Signed)
Problem: Consults Goal: Diagnosis- Total Joint Replacement Outcome: Completed/Met Date Met:  08/13/12 Primary Total Hip LEFT

## 2012-08-13 NOTE — Progress Notes (Signed)
Physical Therapy Treatment Note   08/13/12 1600  PT Visit Information  Last PT Received On 08/13/12  Assistance Needed +1  PT Time Calculation  PT Start Time 1504  PT Stop Time 1529  PT Time Calculation (min) 25 min  Subjective Data  Subjective I haven't really had any pain meds since this morning but I'm not hurting right now.  Precautions  Precautions Posterior Hip  Precaution Comments pt able to verbalize all precautions  Restrictions  Weight Bearing Restrictions Yes  LLE Weight Bearing PWB  LLE Partial Weight Bearing Percentage or Pounds <50%  Cognition  Overall Cognitive Status Appears within functional limits for tasks assessed/performed  Bed Mobility  Bed Mobility Sit to Supine  Sit to Supine 4: Min assist  Details for Bed Mobility Assistance assist for L LE   Transfers  Transfers Stand to Sit;Sit to Stand  Sit to Stand 4: Min guard;With upper extremity assist;From chair/3-in-1  Stand to Sit 4: Min guard;With upper extremity assist;To bed  Details for Transfer Assistance verbal cue for hand placement and L LE forward  Ambulation/Gait  Ambulation/Gait Assistance 5: Supervision  Ambulation Distance (Feet) 80 Feet  Assistive device Rolling walker  Ambulation/Gait Assistance Details verbal cue for RW distance  Gait Pattern Step-to pattern;Antalgic  Gait velocity decreased  Exercises  Exercises Total Joint  Total Joint Exercises  Ankle Circles/Pumps AROM;Both;20 reps  Quad Sets AROM;Both;20 reps  Gluteal Sets AROM;Both;20 reps  Towel Squeeze AROM;Both;20 reps  Short [employer] AAROM;15 reps;Left  Heel Slides AAROM;Left;15 reps;Other (comment) (all exercises within precautions)  Hip ABduction/ADduction AAROM;15 reps;Left  Straight Leg Raises AAROM;10 reps;Left  PT - End of Session  Activity Tolerance Patient tolerated treatment well  Patient left in bed;with call bell/phone within reach  Nurse Communication Patient requests pain meds  PT - Assessment/Plan    Comments on Treatment Session Pt ambulated again this afternoon and then performed exercises in supine.  PT Plan Discharge plan remains appropriate;Frequency remains appropriate  Follow Up Recommendations Home health PT  Equipment Recommended None recommended by PT  Acute Rehab PT Goals  PT Goal: Sit to Supine/Side - Progress Progressing toward goal  PT Goal: Sit to Stand - Progress Progressing toward goal  PT Goal: Stand to Sit - Progress Progressing toward goal  PT Goal: Ambulate - Progress Progressing toward goal  PT Goal: Perform Home Exercise Program - Progress Progressing toward goal  PT General Charges  $$ ACUTE PT VISIT 1 Procedure  PT Treatments  $Gait Training 8-22 mins  $Therapeutic Exercise 8-22 mins    Pain: 3/10 L hip, ice applied, RN notified  Zenovia Jarred, PT Pager: [phone]

## 2012-08-13 NOTE — Progress Notes (Signed)
Subjective: 1 Day Post-Op Procedure(s) (LRB): TOTAL HIP ARTHROPLASTY (Left) Patient reports pain as 3 on 0-10 scale.  Hemovac Dcd. He is stable.  Objective: Vital signs in last 24 hours: Temp:  [97.2 F (36.2 C)-98.7 F (37.1 C)] 98.7 F (37.1 C) (11/26 0624) Pulse Rate:  [78-95] 81  (11/26 0624) Resp:  [12-20] 16  (11/26 0624) BP: (113-142)/(64-89) 113/64 mmHg (11/26 0624) SpO2:  [97 %-100 %] 99 % (11/26 0624) FiO2 (%):  [100 %] 100 % (11/25 1820) Weight:  [85.276 kg (188 lb)] 85.276 kg (188 lb) (11/25 1820)  Intake/Output from previous day: 11/25 0701 - 11/26 0700 In: 3216.7 [P.O.:480; I.V.:2636.7; IV Piggyback:100] Out: 3050 [Urine:2595; Drains:305; Blood:150] Intake/Output this shift:     Basename 08/13/12 0425  HGB 13.2    Basename 08/13/12 0425  WBC 13.5*  RBC 4.32  HCT 38.1*  PLT 260    Basename 08/13/12 0425  NA 134*  K 4.2  CL 98  CO2 26  BUN 10  CREATININE 0.89  GLUCOSE 143*  CALCIUM 8.8   No results found for this basename: LABPT:2,INR:2 in the last 72 hours  Dorsiflexion/Plantar flexion intact  Assessment/Plan: 1 Day Post-Op Procedure(s) (LRB): TOTAL HIP ARTHROPLASTY (Left) Up with therapy  Raymond Hernandez A 08/13/2012, 7:19 AM

## 2012-08-14 LAB — BASIC METABOLIC PANEL
BUN: 10 mg/dL (ref 6–23)
CO2: 27 mEq/L (ref 19–32)
Chloride: 99 mEq/L (ref 96–112)
GFR calc non Af Amer: >90 mL/min (ref >90)
Glucose, Bld: 95 mg/dL (ref 70–99)
Potassium: 3.9 mEq/L (ref 3.5–5.1)
Sodium: 135 mEq/L (ref 135–145)

## 2012-08-14 LAB — CBC
HCT: 36.6 % — ABNORMAL LOW (ref 39.0–52.0)
Hemoglobin: 12.5 g/dL — ABNORMAL LOW (ref 13.0–17.0)
RBC: 4.09 MIL/uL — ABNORMAL LOW (ref 4.22–5.81)
WBC: 12.3 10*3/uL — ABNORMAL HIGH (ref 4.0–10.5)

## 2012-08-14 MED ORDER — METHOCARBAMOL 500 MG PO TABS: 500.0000 mg | ORAL_TABLET | Freq: Four times a day (QID) | ORAL | Status: DC | PRN

## 2012-08-14 MED ORDER — HYDROCODONE-ACETAMINOPHEN 5-325 MG PO TABS: 1.0000 | ORAL_TABLET | ORAL | Status: DC | PRN

## 2012-08-14 MED ORDER — RIVAROXABAN 10 MG PO TABS: 10.0000 mg | ORAL_TABLET | Freq: Every day | ORAL | Status: DC

## 2012-08-14 NOTE — Progress Notes (Addendum)
Pt assessment has not changed from am.  Pt verbalized understanding of discharge instructions.  Pt was given d/c forms and prescriptions. Teach back method used. My chart presented to patient, pt refused to set up account, he stated he will wait until he gets home.

## 2012-08-14 NOTE — Progress Notes (Signed)
Physical Therapy Treatment Note   08/14/12 1100  PT Visit Information  Last PT Received On 08/14/12  Assistance Needed +1  PT Time Calculation  PT Start Time 0917  PT Stop Time 0949  PT Time Calculation (min) 32 min  Subjective Data  Subjective It usually takes a couple days to start working (L LE)  Precautions  Precautions Posterior Hip  Restrictions  Weight Bearing Restrictions Yes  LLE Weight Bearing PWB  LLE Partial Weight Bearing Percentage or Pounds <50%  Cognition  Overall Cognitive Status Appears within functional limits for tasks assessed/performed  Bed Mobility  Bed Mobility Supine to Sit  Supine to Sit 5: Supervision  Details for Bed Mobility Assistance pt required increased time to get EOB, cues for assisting L LE to EOB using sheet and/or UEs  Transfers  Transfers Stand to Sit;Sit to Stand  Sit to Stand 5: Supervision;With upper extremity assist;From bed  Stand to Sit 5: Supervision;With upper extremity assist;To chair/3-in-1  Details for Transfer Assistance pt able to recall safe technique  Ambulation/Gait  Ambulation/Gait Assistance 5: Supervision  Ambulation Distance (Feet) 70 Feet  Assistive device Rolling walker  Ambulation/Gait Assistance Details verbal cue for whole foot on floor, pt tends to keep heel elevated to decrease WB  Gait Pattern Step-to pattern;Antalgic  Gait velocity decreased  Stairs Yes  Stairs Assistance 4: Min guard  Stairs Assistance Details (indicate cue type and reason) verbal cue for sequence, pt performs well, given handout  Stair Management Technique One rail Right;Step to pattern;Forwards;With crutches  Number of Stairs 4   Total Joint Exercises  Ankle Circles/Pumps AROM;Both;20 reps  Quad Sets AROM;Both;20 reps  Gluteal Sets AROM;Both;20 reps  Towel Squeeze AROM;Both;20 reps  Short [employer] AROM;15 reps;Left  Heel Slides AAROM;Left;15 reps;Other (comment) (all exercises within precautions)  Hip ABduction/ADduction AAROM;15  reps;Left  Straight Leg Raises AAROM;10 reps;Left  PT - End of Session  Equipment Utilized During Treatment Gait belt  Activity Tolerance Patient tolerated treatment well  Patient left in chair;with call bell/phone within reach;with family/visitor present  PT - Assessment/Plan  Comments on Treatment Session Pt ambulated to stairs, practiced stairs with one crutch and rail, and performed exercises.  Pt given handouts on exercises and stair technique.  Pt had no questions about d/c home with spouse today.  PT Plan Discharge plan remains appropriate;Frequency remains appropriate  Follow Up Recommendations Home health PT  Equipment Recommended None recommended by PT  Acute Rehab PT Goals  PT Goal: Supine/Side to Sit - Progress Progressing toward goal  PT Goal: Sit to Stand - Progress Progressing toward goal  PT Goal: Stand to Sit - Progress Progressing toward goal  PT Goal: Ambulate - Progress Progressing toward goal  PT Goal: Up/Down Stairs - Progress Progressing toward goal  PT Goal: Perform Home Exercise Program - Progress Progressing toward goal  PT General Charges  $$ ACUTE PT VISIT 1 Procedure  PT Treatments  $Gait Training 8-22 mins  $Therapeutic Exercise 8-22 mins  Pain: 4/10 L hip, premedicated   Zenovia Jarred, PT Pager: [phone]

## 2012-08-14 NOTE — Progress Notes (Signed)
   Subjective: 2 Days Post-Op Procedure(s) (LRB): TOTAL HIP ARTHROPLASTY (Left) Patient reports pain as mild.   Patient seen in rounds without Dr. Darrelyn Hillock. Patient is well, and has had no acute complaints or problems. He reports that he is feeling well. He has some soreness around the hip but feels much better compared to the right THA. He is sleeping well. Voiding without difficulty. No BM yet but positive flatus. No issues overnight. Denies shortness of breath and chest pain. He feels able to go home today.  Objective: Vital signs in last 24 hours: Temp:  [97.7 F (36.5 C)-98.5 F (36.9 C)] 98.5 F (36.9 C) (11/27 0619) Pulse Rate:  [66-88] 88  (11/27 0619) Resp:  [16-18] 18  (11/27 0619) BP: (108-118)/(68-77) 115/68 mmHg (11/27 0619) SpO2:  [96 %-99 %] 96 % (11/27 0619)  Intake/Output from previous day:  Intake/Output Summary (Last 24 hours) at 08/14/12 0734 Last data filed at 08/14/12 0721  Gross per 24 hour  Intake  16109 ml  Output   3525 ml  Net  10085 ml    Intake/Output this shift: Total I/O In: -  Out: 400 [Urine:400]  Labs:  [facility] 08/14/12 0444 08/13/12 0425  HGB 12.5* 13.2    Basename 08/14/12 0444 08/13/12 0425  WBC 12.3* 13.5*  RBC 4.09* 4.32  HCT 36.6* 38.1*  PLT 237 260    Basename 08/14/12 0444 08/13/12 0425  NA 135 134*  K 3.9 4.2  CL 99 98  CO2 27 26  BUN 10 10  CREATININE 0.94 0.89  GLUCOSE 95 143*  CALCIUM 8.8 8.8    EXAM General - Patient is Alert and Oriented Extremity - Neurologically intact Neurovascular intact Dorsiflexion/Plantar flexion intact No cellulitis present Dressing/Incision - clean, dry, no drainage Motor Function - intact, moving foot and toes well on exam.   Past Medical History  Diagnosis Date  . Hypertension   . Asthma     EXERCISE INDUCED  . Headache     MIGRAINES - NONE RECDENGT   . Avascular necrosis of hip, right   . History of kidney stones     AGE 36  . GERD (gastroesophageal reflux disease)      Assessment/Plan: 2 Days Post-Op Procedure(s) (LRB): TOTAL HIP ARTHROPLASTY (Left) Active Problems:  Avascular necrosis of bone of hip  Estimated Body mass index is 27.76 kg/(m^2) as calculated from the following:   Height as of this encounter: 5\' 9" (1.753 m).   Weight as of this encounter: 188 lb(85.276 kg). Advance diet Up with therapy D/C IV fluids Discharge home  DVT Prophylaxis - Xarelto 50% PWB for 2 weeks  Will do at least one session of PT today before discharge. Will follow up in office in 2 weeks. Patient has walker, cane, and crutches already.   Aydrian Halpin LAUREN 08/14/2012, 7:34 AM

## 2012-08-19 NOTE — Discharge Summary (Signed)
Physician Discharge Summary   Patient ID: Raymond Hernandez MRN: [id number] DOB/AGE: 1975-12-23 36 y.o.  Admit date: 08/12/2012 Discharge date: 08/19/2012  Primary Diagnosis:  Avascular necrosis, left hip  Admission Diagnoses:  Past Medical History  Diagnosis Date  . Hypertension   . Asthma     EXERCISE INDUCED  . Headache     MIGRAINES - NONE RECDENGT   . Avascular necrosis of hip, right   . History of kidney stones     AGE 12  . GERD (gastroesophageal reflux disease)    Discharge Diagnoses:   Active Problems:  Avascular necrosis of bone of hip S/P left total hip arthroplasty  Estimated Body mass index is 27.76 kg/(m^2) as calculated from the following:   Height as of this encounter: 5\' 9" (1.753 m).   Weight as of this encounter: 188 lb(85.276 kg).  Classification of overweight in adults according to BMI (WHO, 1998)   Procedure: Procedure(s) (LRB): TOTAL HIP ARTHROPLASTY (Left)   Consults: None  HPI: Raymond Hernandez, 36 y.o. male, has a history of pain and functional disability in the left hip(s) due to avascular necrosis and patient has failed non-surgical conservative treatments for greater than 12 weeks to include use of assistive devices and activity modification. Onset of symptoms was gradual starting 1 year ago with rapidlly worsening course since that time.The patient noted no past surgery on the left hip(s). Patient currently rates pain in the left hip at 7 out of 10 with activity. Patient has worsening of pain with activity and weight bearing, pain that interfers with activities of daily living and pain with passive range of motion. Patient has evidence of subchondral sclerosis, joint space narrowing and necrosis of the femoral head by imaging studies. This condition presents safety issues increasing the risk of falls. There is no current active infection.   Laboratory Data: Admission on 08/12/2012, Discharged on 08/14/2012  Component Date Value Range Status  .  ABO/RH(D) 08/12/2012 A POS   Final  . Antibody Screen 08/12/2012 NEG   Final  . Sample Expiration 08/12/2012 08/15/2012   Final  . WBC 08/13/2012 13.5* 4.0 - 10.5 K/uL Final  . RBC 08/13/2012 4.32  4.22 - 5.81 MIL/uL Final  . Hemoglobin 08/13/2012 13.2  13.0 - 17.0 g/dL Final  . HCT 40/98/1191 38.1* 39.0 - 52.0 % Final  . MCV 08/13/2012 88.2  78.0 - 100.0 fL Final  . MCH 08/13/2012 30.6  26.0 - 34.0 pg Final  . MCHC 08/13/2012 34.6  30.0 - 36.0 g/dL Final  . RDW 47/82/9562 12.3  11.5 - 15.5 % Final  . Platelets 08/13/2012 260  150 - 400 K/uL Final  . Sodium 08/13/2012 134* 135 - 145 mEq/L Final  . Potassium 08/13/2012 4.2  3.5 - 5.1 mEq/L Final  . Chloride 08/13/2012 98  96 - 112 mEq/L Final  . CO2 08/13/2012 26  19 - 32 mEq/L Final  . Glucose, Bld 08/13/2012 143* 70 - 99 mg/dL Final  . BUN 13/04/6577 10  6 - 23 mg/dL Final  . Creatinine, Ser 08/13/2012 0.89  0.50 - 1.35 mg/dL Final  . Calcium 46/96/2952 8.8  8.4 - 10.5 mg/dL Final  . GFR calc non Af Amer 08/13/2012 >90  >90 mL/min Final  . GFR calc Af Amer 08/13/2012 >90  >90 mL/min Final   Comment:  The eGFR has been calculated                          using the CKD EPI equation.                          This calculation has not been                          validated in all clinical                          situations.                          eGFR's persistently                          <90 mL/min signify                          possible Chronic Kidney Disease.  . WBC 08/14/2012 12.3* 4.0 - 10.5 K/uL Final  . RBC 08/14/2012 4.09* 4.22 - 5.81 MIL/uL Final  . Hemoglobin 08/14/2012 12.5* 13.0 - 17.0 g/dL Final  . HCT 16/06/9603 36.6* 39.0 - 52.0 % Final  . MCV 08/14/2012 89.5  78.0 - 100.0 fL Final  . MCH 08/14/2012 30.6  26.0 - 34.0 pg Final  . MCHC 08/14/2012 34.2  30.0 - 36.0 g/dL Final  . RDW 54/05/8118 12.5  11.5 - 15.5 % Final  . Platelets 08/14/2012 237  150 - 400 K/uL Final  . Sodium  08/14/2012 135  135 - 145 mEq/L Final  . Potassium 08/14/2012 3.9  3.5 - 5.1 mEq/L Final  . Chloride 08/14/2012 99  96 - 112 mEq/L Final  . CO2 08/14/2012 27  19 - 32 mEq/L Final  . Glucose, Bld 08/14/2012 95  70 - 99 mg/dL Final  . BUN 14/78/2956 10  6 - 23 mg/dL Final  . Creatinine, Ser 08/14/2012 0.94  0.50 - 1.35 mg/dL Final  . Calcium 21/30/8657 8.8  8.4 - 10.5 mg/dL Final  . GFR calc non Af Amer 08/14/2012 >90  >90 mL/min Final  . GFR calc Af Amer 08/14/2012 >90  >90 mL/min Final   Comment:                                 The eGFR has been calculated                          using the CKD EPI equation.                          This calculation has not been                          validated in all clinical                          situations.                          eGFR's persistently                          <  90 mL/min signify                          possible Chronic Kidney Disease.  Hospital Outpatient Visit on 08/06/2012  Component Date Value Range Status  . aPTT 08/06/2012 30  24 - 37 seconds Final  . Sodium 08/06/2012 137  135 - 145 mEq/L Final  . Potassium 08/06/2012 4.2  3.5 - 5.1 mEq/L Final  . Chloride 08/06/2012 100  96 - 112 mEq/L Final  . CO2 08/06/2012 24  19 - 32 mEq/L Final  . Glucose, Bld 08/06/2012 86  70 - 99 mg/dL Final  . BUN 16/06/9603 11  6 - 23 mg/dL Final  . Creatinine, Ser 08/06/2012 0.91  0.50 - 1.35 mg/dL Final  . Calcium 54/05/8118 9.4  8.4 - 10.5 mg/dL Final  . Total Protein 08/06/2012 7.8  6.0 - 8.3 g/dL Final  . Albumin 14/78/2956 4.0  3.5 - 5.2 g/dL Final  . AST 21/30/8657 18  0 - 37 U/L Final  . ALT 08/06/2012 26  0 - 53 U/L Final  . Alkaline Phosphatase 08/06/2012 80  39 - 117 U/L Final  . Total Bilirubin 08/06/2012 0.3  0.3 - 1.2 mg/dL Final  . GFR calc non Af Amer 08/06/2012 >90  >90 mL/min Final  . GFR calc Af Amer 08/06/2012 >90  >90 mL/min Final   Comment:                                 The eGFR has been calculated                           using the CKD EPI equation.                          This calculation has not been                          validated in all clinical                          situations.                          eGFR's persistently                          <90 mL/min signify                          possible Chronic Kidney Disease.  Marland Kitchen Prothrombin Time 08/06/2012 11.9  11.6 - 15.2 seconds Final  . INR 08/06/2012 0.88  0.00 - 1.49 Final  . Color, Urine 08/06/2012 YELLOW  YELLOW Final  . APPearance 08/06/2012 CLEAR  CLEAR Final  . Specific Gravity, Urine 08/06/2012 1.025  1.005 - 1.030 Final  . pH 08/06/2012 6.0  5.0 - 8.0 Final  . Glucose, UA 08/06/2012 NEGATIVE  NEGATIVE mg/dL Final  . Hgb urine dipstick 08/06/2012 NEGATIVE  NEGATIVE Final  . Bilirubin Urine 08/06/2012 NEGATIVE  NEGATIVE Final  . Ketones, ur 08/06/2012 NEGATIVE  NEGATIVE mg/dL Final  . Protein, ur 84/69/6295 NEGATIVE  NEGATIVE mg/dL Final  . Urobilinogen, UA 08/06/2012 0.2  0.0 - 1.0 mg/dL  Final  . Nitrite 08/06/2012 NEGATIVE  NEGATIVE Final  . Leukocytes, UA 08/06/2012 NEGATIVE  NEGATIVE Final   MICROSCOPIC NOT DONE ON URINES WITH NEGATIVE PROTEIN, BLOOD, LEUKOCYTES, NITRITE, OR GLUCOSE <1000 mg/dL.  Marland Kitchen MRSA, PCR 08/06/2012 NEGATIVE  NEGATIVE Final  . Staphylococcus aureus 08/06/2012 NEGATIVE  NEGATIVE Final   Comment:                                 The Xpert SA Assay (FDA                          approved for NASAL specimens                          in patients over 62 years of age),                          is one component of                          a comprehensive surveillance                          program.  Test performance has                          been validated by [employer] for patients greater                          than or equal to 36 year old.                          It is not intended                          to diagnose infection nor to                          guide or  monitor treatment.  . WBC 08/06/2012 7.5  4.0 - 10.5 K/uL Final  . RBC 08/06/2012 5.04  4.22 - 5.81 MIL/uL Final  . Hemoglobin 08/06/2012 15.6  13.0 - 17.0 g/dL Final  . HCT 16/06/9603 44.9  39.0 - 52.0 % Final  . MCV 08/06/2012 89.1  78.0 - 100.0 fL Final  . MCH 08/06/2012 31.0  26.0 - 34.0 pg Final  . MCHC 08/06/2012 34.7  30.0 - 36.0 g/dL Final  . RDW 54/05/8118 12.4  11.5 - 15.5 % Final  . Platelets 08/06/2012 313  150 - 400 K/uL Final     X-Rays:Dg Hip Portable 1 View Left  08/12/2012  *RADIOLOGY REPORT*  Clinical Data: History of total hip replacement.  Postop.  PORTABLE LEFT HIP - 1 VIEW  Comparison: None.  Findings: Single portable AP image of the left hip is submitted. Left hip arthroplasty has been performed.  No dislocation is seen. No disruption of hardware is evident.  Surgical drains are in place.  IMPRESSION: Postoperative appearance.  Post left hip arthroplasty procedure.  Original Report Authenticated By: Onalee Hua Call     EKG: Orders placed during the hospital encounter of 02/05/12  . EKG     Hospital Course: Patient was admitted to [facility] and taken to the OR and underwent the above state procedure without complications.  Patient tolerated the procedure well and was later transferred to the recovery room and then to the orthopaedic floor for postoperative care.  They were given PO and IV analgesics for pain control following their surgery.  They were given 24 hours of postoperative antibiotics of  Anti-infectives     Start     Dose/Rate Route Frequency Ordered Stop   08/12/12 2130   ceFAZolin (ANCEF) IVPB 1 g/50 mL premix        1 g 100 mL/hr over 30 Minutes Intravenous Every 6 hours 08/12/12 1835 08/13/12 0403   08/12/12 1629   polymyxin B 500,000 Units, bacitracin 50,000 Units in sodium chloride irrigation 0.9 % 500 mL irrigation  Status:  Discontinued          As needed 08/12/12 1629 08/12/12 1711   08/12/12 1601   polymyxin B 500,000 Units, bacitracin  50,000 Units in sodium chloride irrigation 0.9 % 500 mL irrigation  Status:  Discontinued          As needed 08/12/12 1601 08/12/12 1711   08/11/12 1443   ceFAZolin (ANCEF) IVPB 2 g/50 mL premix        2 g 100 mL/hr over 30 Minutes Intravenous 60 min pre-op 08/11/12 1443 08/12/12 1530         and started on DVT prophylaxis in the form of Xarelto.   PT and OT were ordered for total hip protocol.  The patient was allowed to be PWB 50%. Discharge planning was consulted to help with postop disposition and equipment needs.  Patient had a decent night on the evening of surgery and started to get up OOB with therapy on day one.  Hemovac drain was pulled without difficulty.  He reported feeling "a lot better" than he did after the first night after the right total hip arthroplasty.The knee immobilizer was removed and discontinued.  Continued to work with therapy into day two. By day two, the patient had progressed with therapy and meeting their goals.  Incision was healing well.  Patient was seen in rounds and was ready to go home.   Discharge Medications: Prior to Admission medications   Medication Sig Start Date End Date Taking? Authorizing Provider  albuterol (PROVENTIL HFA;VENTOLIN HFA) 108 (90 BASE) MCG/ACT inhaler Inhale 2 puffs into the lungs every 6 (six) hours as needed. Wheezing   Yes Historical Provider, MD  lisinopril (PRINIVIL,ZESTRIL) 20 MG tablet Take 20 mg by mouth every evening.   Yes Historical Provider, MD  EPINEPHrine (EPIPEN 2-PAK) 0.3 mg/0.3 mL DEVI Inject 0.3 mg into the muscle once.    Historical Provider, MD  HYDROcodone-acetaminophen (NORCO/VICODIN) 5-325 MG per tablet Take 1-2 tablets by mouth every 4 (four) hours as needed (breakthrough pain). 08/14/12   Alaa Eyerman Tamala Ser, PA  methocarbamol (ROBAXIN) 500 MG tablet Take 1 tablet (500 mg total) by mouth every 6 (six) hours as needed. 08/14/12   Slaton Reaser Tamala Ser, PA  rivaroxaban (XARELTO) 10 MG TABS tablet Take 1  tablet (10 mg total) by mouth daily with breakfast. 08/14/12   Suriya Kovarik Tamala Ser, PA    Diet: Regular diet Activity:PWB 50% No bending hip over 90 degrees- A "L" Angle Do not cross legs Do not let  foot roll inward When turning these patients a pillow should be placed between the patient's legs to prevent crossing. Patients should have the affected knee fully extended when trying to sit or stand from all surfaces to prevent excessive hip flexion. When ambulating and turning toward the affected side the affected leg should have the toes turned out prior to moving the walker and the rest of patient's body as to prevent internal rotation/ turning in of the leg. Abduction pillows are the most effective way to prevent a patient from not crossing legs or turning toes in at rest. If an abduction pillow is not ordered placing a regular pillow length wise between the patient's legs is also an effective reminder. It is imperative that these precautions be maintained so that the surgical hip does not dislocate. Follow-up:in 2 weeks Disposition - Home Discharged Condition: good   Discharge Orders    Future Orders Please Complete By Expires   Call MD / Call 911      Comments:   If you experience chest pain or shortness of breath, CALL 911 and be transported to the hospital emergency room.  If you develope a fever above 101 F, pus (white drainage) or increased drainage or redness at the wound, or calf pain, call your surgeon's office.   Constipation Prevention      Comments:   Drink plenty of fluids.  Prune juice may be helpful.  You may use a stool softener, such as Colace (over the counter) 100 mg twice a day.  Use MiraLax (over the counter) for constipation as needed.   Increase activity slowly as tolerated      Discharge instructions      Comments:   Walk with your walker. Weight bearing as instructed. Follow instructions from home health therapy Change your dressing on post op day eight  with some gauze and paper tape Shower only, no tub bath. Call if any temperatures greater than 101 or any wound complications: [phone] during the day and ask for Dr. Jeannetta Ellis nurse, [facility].   Driving restrictions      Comments:   No driving for 2 weeks   Follow the hip precautions as taught in Physical Therapy          Medication List     As of 08/19/2012  7:40 AM    STOP taking these medications         aspirin 325 MG tablet      TAKE these medications         albuterol 108 (90 BASE) MCG/ACT inhaler   Commonly known as: PROVENTIL HFA;VENTOLIN HFA   Inhale 2 puffs into the lungs every 6 (six) hours as needed. Wheezing      EPIPEN 2-PAK 0.3 mg/0.3 mL Devi   Generic drug: EPINEPHrine   Inject 0.3 mg into the muscle once.      HYDROcodone-acetaminophen 5-325 MG per tablet   Commonly known as: NORCO/VICODIN   Take 1-2 tablets by mouth every 4 (four) hours as needed (breakthrough pain).      lisinopril 20 MG tablet   Commonly known as: PRINIVIL,ZESTRIL   Take 20 mg by mouth every evening.      methocarbamol 500 MG tablet   Commonly known as: ROBAXIN   Take 1 tablet (500 mg total) by mouth every 6 (six) hours as needed.      rivaroxaban 10 MG Tabs tablet   Commonly known as: XARELTO   Take 1 tablet (10 mg total) by mouth  daily with breakfast.         Signed: Tayloranne Lekas LAUREN 08/19/2012, 7:40 AM

## 2012-11-08 IMAGING — CR DG KNEE 1-2V*R*
2 series · 2 of 2 positions shown · non-contrast
Comparison: None.

CLINICAL DATA: Right knee pain, persistent

RIGHT KNEE - 1-2 VIEW

[view not recorded (1 of 2)]
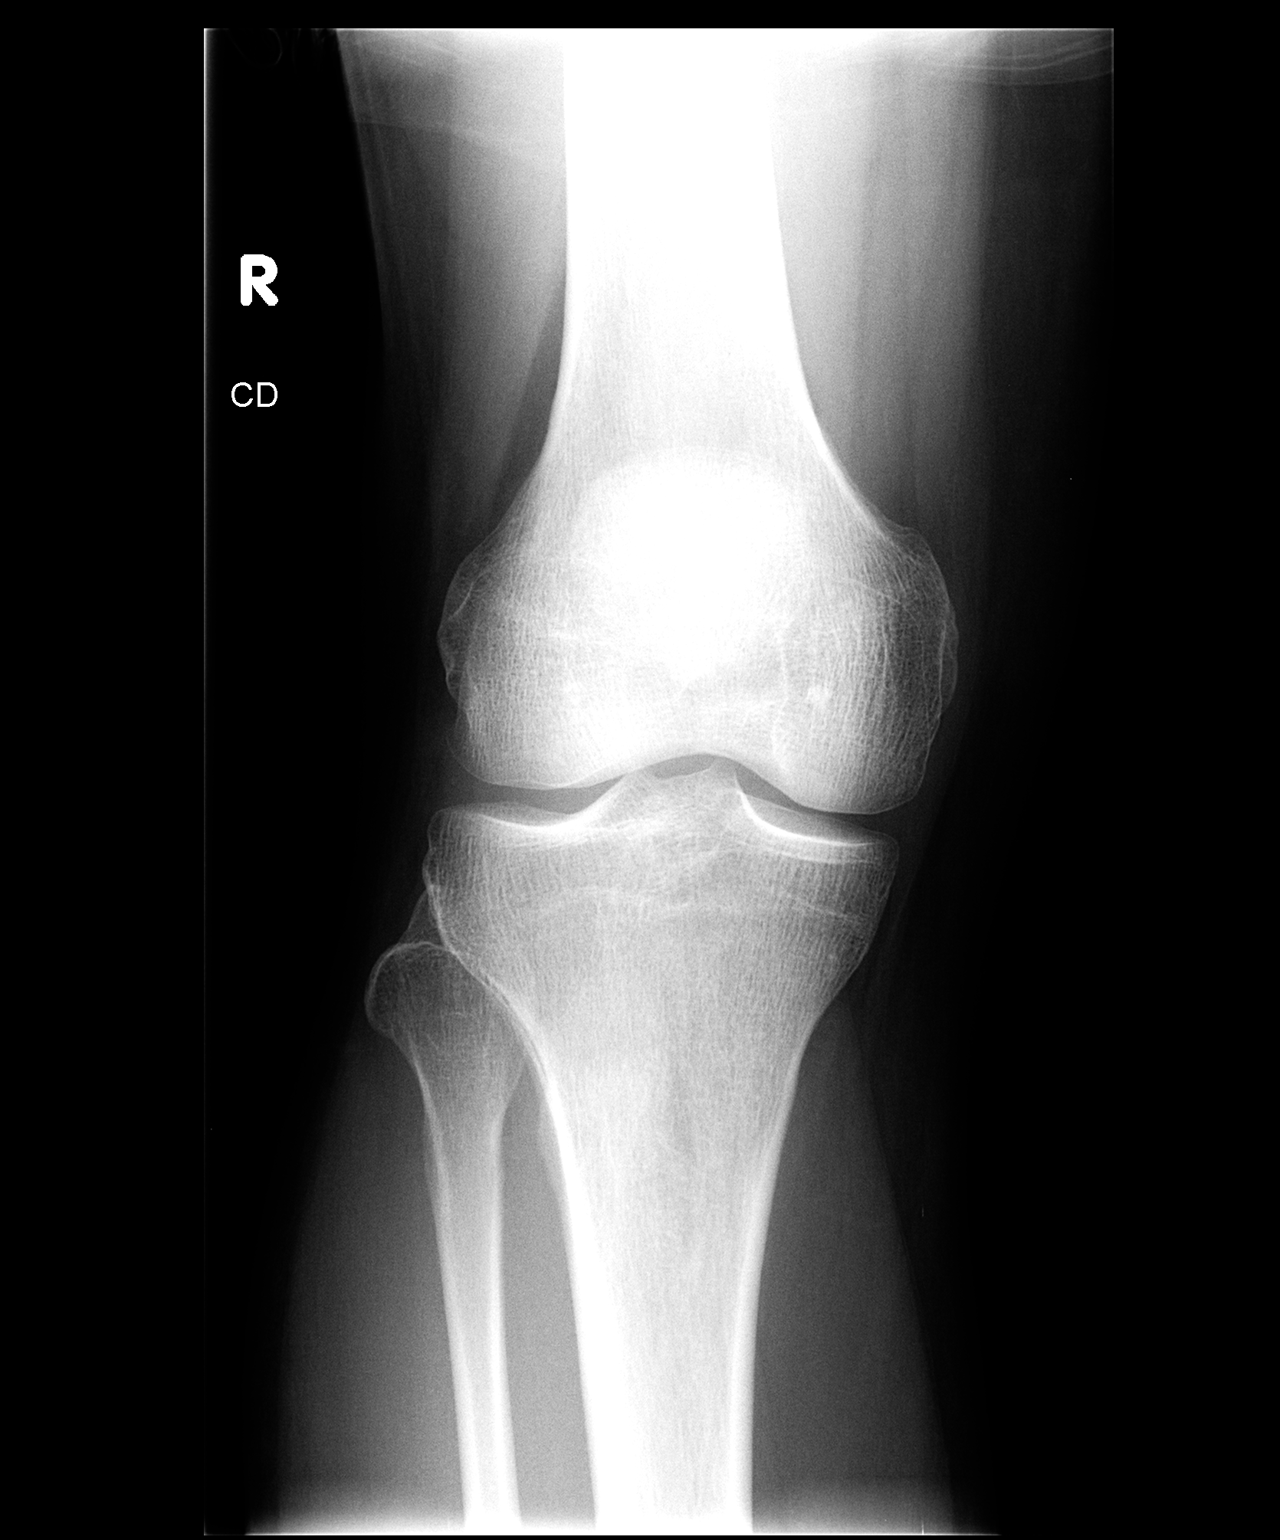

[view not recorded (2 of 2)]
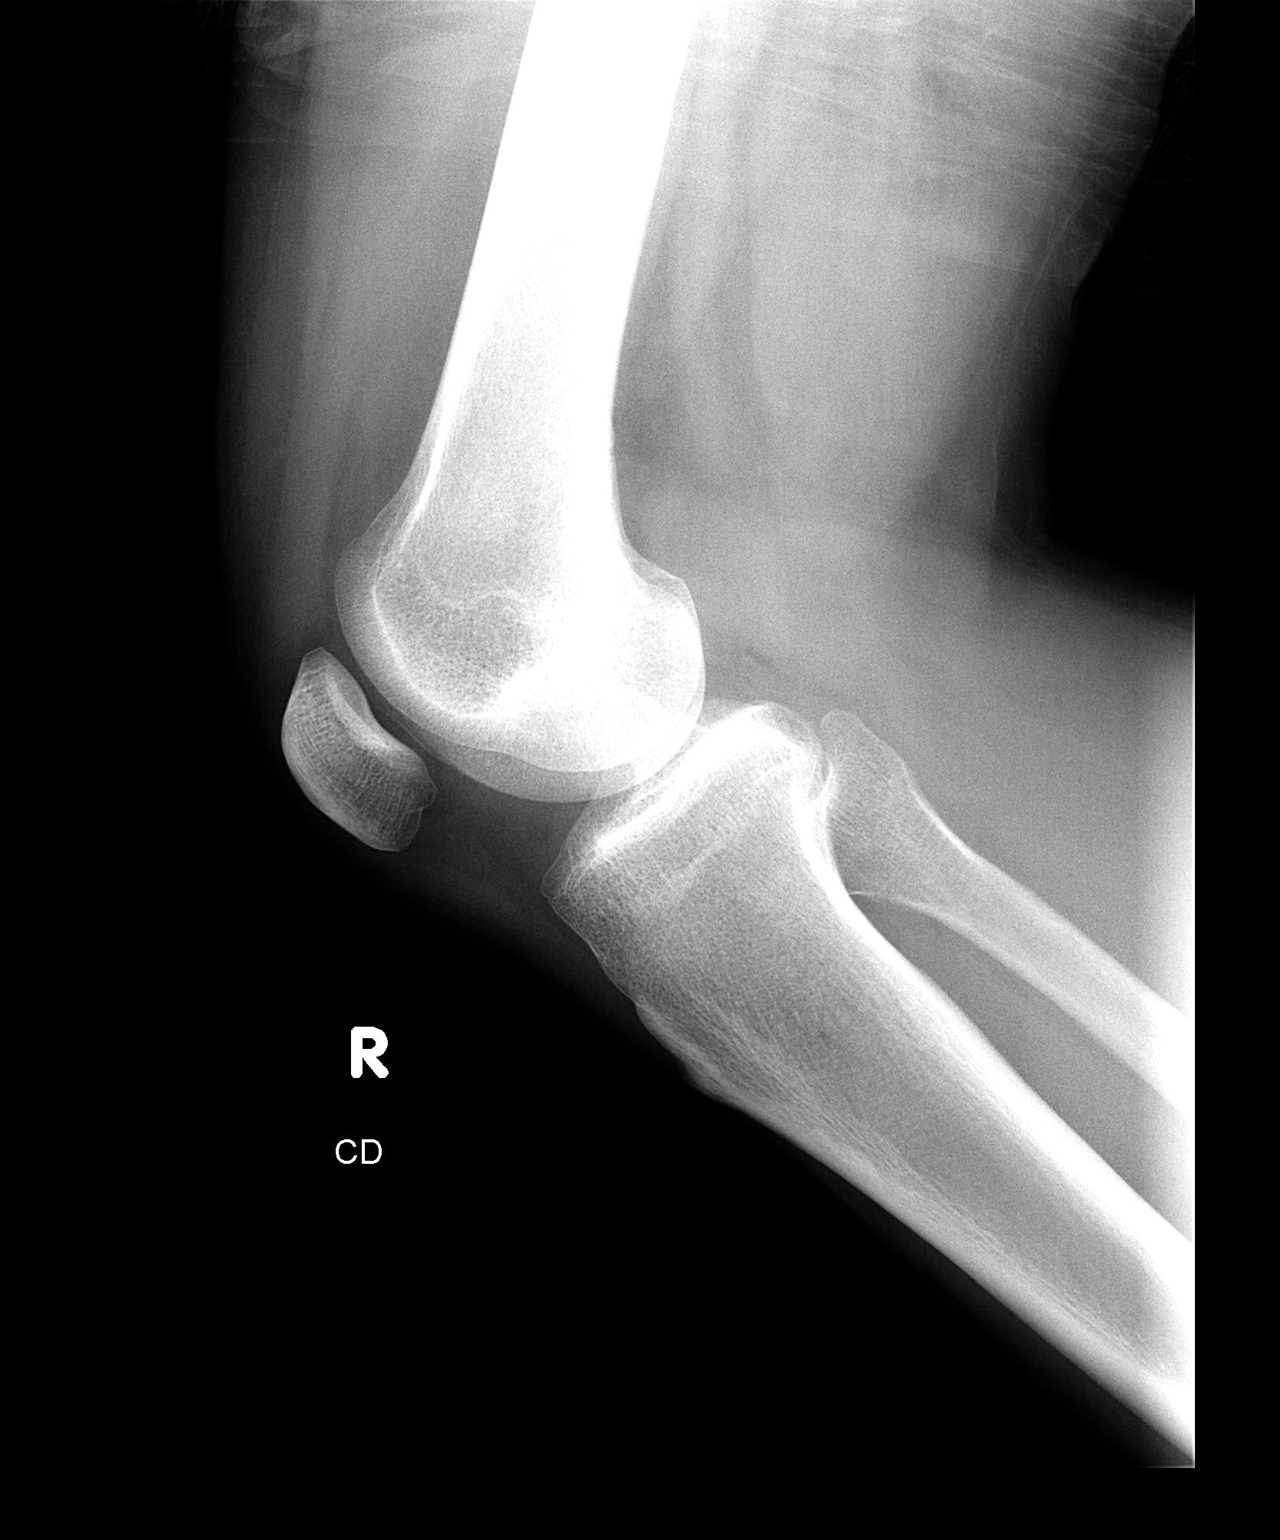

[2 of 2 positions shown; findings below may reference images not displayed]

FINDINGS: Normal alignment without fracture or effusion.  Preserved
joint spaces.  No significant arthritic change.  Incidental tiny
bone island in the medial femoral condyle region.
IMPRESSION: No acute finding by plain radiography.

## 2013-10-17 IMAGING — CR DG HIP 1V PORT*L*
1 series · 1 of 1 positions shown · non-contrast
Comparison: None.

CLINICAL DATA: History of total hip replacement.  Postop.

PORTABLE LEFT HIP - 1 VIEW

[AP]
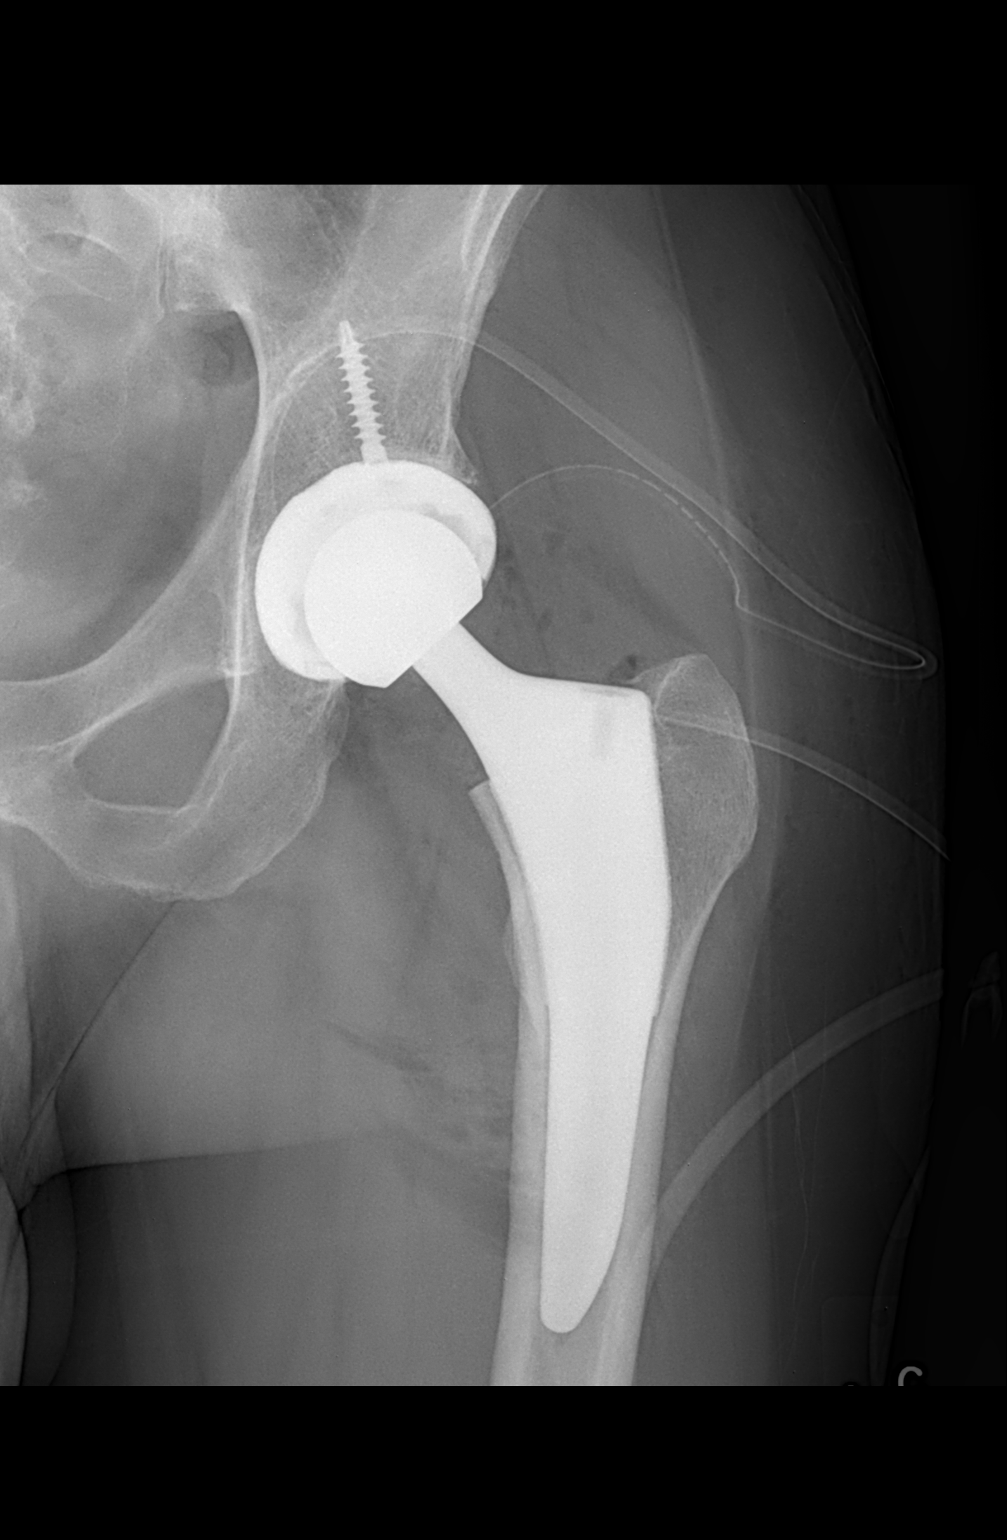

[1 of 1 positions shown; findings below may reference images not displayed]

FINDINGS: Single portable AP image of the left hip is submitted.
Left hip arthroplasty has been performed.  No dislocation is seen.
No disruption of hardware is evident.  Surgical drains are in
place.
IMPRESSION: Postoperative appearance.  Post left hip arthroplasty procedure.

## 2013-11-11 ENCOUNTER — Encounter: Payer: Self-pay | Admitting: Physician Assistant

## 2015-01-19 ENCOUNTER — Other Ambulatory Visit (HOSPITAL_COMMUNITY): Payer: Self-pay | Admitting: Internal Medicine

## 2015-01-19 DIAGNOSIS — R079 Chest pain, unspecified: Secondary | ICD-10-CM

## 2015-01-26 ENCOUNTER — Ambulatory Visit (HOSPITAL_COMMUNITY)
Admission: RE | Admit: 2015-01-26 | Discharge: 2015-01-26 | Disposition: A | Discharge status: Home / Self Care | Payer: PRIVATE HEALTH INSURANCE | Source: Ambulatory Visit | Attending: Internal Medicine | Admitting: Internal Medicine

## 2015-01-26 DIAGNOSIS — R079 Chest pain, unspecified: Secondary | ICD-10-CM | POA: Insufficient documentation

## 2015-01-26 LAB — EXERCISE TOLERANCE TEST
CHL CUP RESTING HR STRESS: 102 {beats}/min
CSEPED: 9 min
CSEPEDS: 0 s
CSEPEW: 10.1 METS
Peak HR: 187 {beats}/min

## 2015-11-09 ENCOUNTER — Other Ambulatory Visit: Payer: Self-pay | Admitting: Otolaryngology

## 2015-11-09 DIAGNOSIS — E041 Nontoxic single thyroid nodule: Secondary | ICD-10-CM

## 2015-11-15 ENCOUNTER — Other Ambulatory Visit: Payer: Self-pay | Admitting: Otolaryngology

## 2015-11-15 ENCOUNTER — Inpatient Hospital Stay
Admission: RE | Admit: 2015-11-15 | Discharge: 2015-11-15 | Disposition: A | Discharge status: Home / Self Care | Payer: Self-pay | Source: Ambulatory Visit | Attending: Otolaryngology | Admitting: Otolaryngology

## 2015-11-15 DIAGNOSIS — E041 Nontoxic single thyroid nodule: Secondary | ICD-10-CM

## 2015-11-25 ENCOUNTER — Other Ambulatory Visit (HOSPITAL_COMMUNITY)
Admission: RE | Admit: 2015-11-25 | Discharge: 2015-11-25 | Disposition: A | Discharge status: Home / Self Care | Payer: PRIVATE HEALTH INSURANCE | Source: Ambulatory Visit | Attending: Radiology | Admitting: Radiology

## 2015-11-25 ENCOUNTER — Ambulatory Visit
Admission: RE | Admit: 2015-11-25 | Discharge: 2015-11-25 | Disposition: A | Discharge status: Home / Self Care | Payer: PRIVATE HEALTH INSURANCE | Source: Ambulatory Visit | Attending: Otolaryngology | Admitting: Otolaryngology

## 2015-11-25 DIAGNOSIS — E041 Nontoxic single thyroid nodule: Secondary | ICD-10-CM

## 2018-08-02 ENCOUNTER — Other Ambulatory Visit: Payer: Self-pay | Admitting: Internal Medicine

## 2018-08-02 DIAGNOSIS — N5089 Other specified disorders of the male genital organs: Secondary | ICD-10-CM

## 2018-08-13 ENCOUNTER — Ambulatory Visit
Admission: RE | Admit: 2018-08-13 | Discharge: 2018-08-13 | Disposition: A | Discharge status: Home / Self Care | Payer: PRIVATE HEALTH INSURANCE | Source: Ambulatory Visit | Attending: Internal Medicine | Admitting: Internal Medicine

## 2018-08-13 DIAGNOSIS — N5089 Other specified disorders of the male genital organs: Secondary | ICD-10-CM

## 2021-12-05 ENCOUNTER — Encounter: Payer: Self-pay | Admitting: Gastroenterology

## 2021-12-14 ENCOUNTER — Ambulatory Visit (AMBULATORY_SURGERY_CENTER): Payer: PRIVATE HEALTH INSURANCE | Admitting: *Deleted

## 2021-12-14 ENCOUNTER — Other Ambulatory Visit: Payer: Self-pay

## 2021-12-14 VITALS — Ht 69.0 in | Wt 188.0 lb

## 2021-12-14 DIAGNOSIS — Z1211 Encounter for screening for malignant neoplasm of colon: Secondary | ICD-10-CM

## 2021-12-14 MED ORDER — NA SULFATE-K SULFATE-MG SULF 17.5-3.13-1.6 GM/177ML PO SOLN: 1.0000 | ORAL | 0 refills | Status: DC

## 2021-12-14 NOTE — Progress Notes (Signed)
Patient's pre-visit was done today over the phone with the patient. Name,DOB and address verified. Patient denies any allergies to Eggs and Soy. Patient denies any problems with anesthesia/sedation. Patient is not taking any diet pills or blood thinners. No home Oxygen. Insurance confirmed with patient. ? ?Prep instructions sent to pt's MyChart (if available) -pt is aware. Patient understands to call us back with any questions or concerns. Patient is aware of our care-partner policy and [id number] safety protocol.  ? ?EMMI education assigned to the patient for the procedure, sent to [facility].  ? ?The patient is COVID-19 vaccinated.   ?

## 2021-12-27 ENCOUNTER — Encounter: Payer: Self-pay | Admitting: Gastroenterology

## 2022-01-03 ENCOUNTER — Encounter: Payer: Self-pay | Admitting: Certified Registered Nurse Anesthetist

## 2022-01-04 ENCOUNTER — Ambulatory Visit (AMBULATORY_SURGERY_CENTER): Payer: No Typology Code available for payment source | Admitting: Gastroenterology

## 2022-01-04 ENCOUNTER — Encounter: Payer: Self-pay | Admitting: Gastroenterology

## 2022-01-04 VITALS — BP 114/78 | HR 79 | Temp 98.0°F | Resp 17 | Ht 69.0 in | Wt 188.0 lb

## 2022-01-04 DIAGNOSIS — Z1211 Encounter for screening for malignant neoplasm of colon: Secondary | ICD-10-CM | POA: Diagnosis not present

## 2022-01-04 DIAGNOSIS — K621 Rectal polyp: Secondary | ICD-10-CM

## 2022-01-04 DIAGNOSIS — K635 Polyp of colon: Secondary | ICD-10-CM

## 2022-01-04 DIAGNOSIS — D123 Benign neoplasm of transverse colon: Secondary | ICD-10-CM

## 2022-01-04 DIAGNOSIS — D128 Benign neoplasm of rectum: Secondary | ICD-10-CM

## 2022-01-04 MED ORDER — SODIUM CHLORIDE 0.9 % IV SOLN: 500.0000 mL | Freq: Once | INTRAVENOUS | Status: DC

## 2022-01-04 NOTE — Progress Notes (Signed)
Called to room to assist during endoscopic procedure.  Patient ID and intended procedure confirmed with present staff. Received instructions for my participation in the procedure from the performing physician.  

## 2022-01-04 NOTE — Patient Instructions (Signed)
Handout on polyps, and hemorrhoids provided  ? ?Await pathology results.  ? ?Continue current medications.  ? ?YOU HAD AN ENDOSCOPIC PROCEDURE TODAY AT [facility]:   Refer to the procedure report that was given to you for any specific questions about what was found during the examination.  If the procedure report does not answer your questions, please call your gastroenterologist to clarify.  If you requested that your care partner not be given the details of your procedure findings, then the procedure report has been included in a sealed envelope for you to review at your convenience later. ? ?YOU SHOULD EXPECT: Some feelings of bloating in the abdomen. Passage of more gas than usual.  Walking can help get rid of the air that was put into your GI tract during the procedure and reduce the bloating. If you had a lower endoscopy (such as a colonoscopy or flexible sigmoidoscopy) you may notice spotting of blood in your stool or on the toilet paper. If you underwent a bowel prep for your procedure, you may not have a normal bowel movement for a few days. ? ?Please Note:  You might notice some irritation and congestion in your nose or some drainage.  This is from the oxygen used during your procedure.  There is no need for concern and it should clear up in a day or so. ? ?SYMPTOMS TO REPORT IMMEDIATELY: ? ?Following lower endoscopy (colonoscopy or flexible sigmoidoscopy): ? Excessive amounts of blood in the stool ? Significant tenderness or worsening of abdominal pains ? Swelling of the abdomen that is new, acute ? Fever of 100?F or higher ? ?For urgent or emergent issues, a gastroenterologist can be reached at any hour by calling [phone]. ?Do not use MyChart messaging for urgent concerns.  ? ? ?DIET:  We do recommend a small meal at first, but then you may proceed to your regular diet.  Drink plenty of fluids but you should avoid alcoholic beverages for 24 hours. ? ?ACTIVITY:  You should plan to  take it easy for the rest of today and you should NOT DRIVE or use heavy machinery until tomorrow (because of the sedation medicines used during the test).   ? ?FOLLOW UP: ?Our staff will call the number listed on your records 48-72 hours following your procedure to check on you and address any questions or concerns that you may have regarding the information given to you following your procedure. If we do not reach you, we will leave a message.  We will attempt to reach you two times.  During this call, we will ask if you have developed any symptoms of COVID 19. If you develop any symptoms (ie: fever, flu-like symptoms, shortness of breath, cough etc.) before then, please call [phone].  If you test positive for Covid 19 in the 2 weeks post procedure, please call and report this information to [place].   ? ?If any biopsies were taken you will be contacted by phone or by letter within the next 1-3 weeks.  Please call us at [phone] if you have not heard about the biopsies in 3 weeks.  ? ? ?SIGNATURES/CONFIDENTIALITY: ?You and/or your care partner have signed paperwork which will be entered into your electronic medical record.  These signatures attest to the fact that that the information above on your After Visit Summary has been reviewed and is understood.  Full responsibility of the confidentiality of this discharge information lies with you and/or your care-partner. ? ? ?

## 2022-01-04 NOTE — Progress Notes (Signed)
Vs in adm by DT ?

## 2022-01-04 NOTE — Progress Notes (Signed)
? ?GASTROENTEROLOGY PROCEDURE H&P NOTE  ? ?Primary Care Physician: ?Sueanne Margarita, DO ? ?HPI: ?Contrell Ballentine is a 46 y.o. male who presents for Colonoscopy for screening. ? ?Past Medical History:  ?Diagnosis Date  ? Asthma   ? EXERCISE INDUCED  ? Avascular necrosis of hip, right ([place])   ? GERD (gastroesophageal reflux disease)   ? Headache(784.0)   ? MIGRAINES - NONE RECDENGT   ? History of kidney stones   ? AGE 89  ? Hypertension   ? ?Past Surgical History:  ?Procedure Laterality Date  ? TOTAL HIP ARTHROPLASTY  02/05/2012  ? Procedure: TOTAL HIP ARTHROPLASTY;  Surgeon: Tobi Bastos, MD;  Location: [facility];  Service: Orthopedics;  Laterality: Right;  ? TOTAL HIP ARTHROPLASTY  08/12/2012  ? Procedure: TOTAL HIP ARTHROPLASTY;  Surgeon: Tobi Bastos, MD;  Location: [facility];  Service: Orthopedics;  Laterality: Left;  ? ?Current Outpatient Medications  ?Medication Sig Dispense Refill  ? lisinopril (PRINIVIL,ZESTRIL) 20 MG tablet Take 20 mg by mouth every evening.    ? albuterol (PROVENTIL HFA;VENTOLIN HFA) 108 (90 BASE) MCG/ACT inhaler Inhale 2 puffs into the lungs every 6 (six) hours as needed. Wheezing    ? EPINEPHrine (EPI-PEN) 0.3 mg/0.3 mL DEVI Inject 0.3 mg into the muscle once. (Patient not taking: Reported on 12/14/2021)    ? ?Current Facility-Administered Medications  ?Medication Dose Route Frequency Provider Last Rate Last Admin  ? 0.9 %  sodium chloride infusion  500 mL Intravenous Once Mansouraty, Telford Nab., MD      ? ? ?Current Outpatient Medications:  ?  lisinopril (PRINIVIL,ZESTRIL) 20 MG tablet, Take 20 mg by mouth every evening., Disp: , Rfl:  ?  albuterol (PROVENTIL HFA;VENTOLIN HFA) 108 (90 BASE) MCG/ACT inhaler, Inhale 2 puffs into the lungs every 6 (six) hours as needed. Wheezing, Disp: , Rfl:  ?  EPINEPHrine (EPI-PEN) 0.3 mg/0.3 mL DEVI, Inject 0.3 mg into the muscle once. (Patient not taking: Reported on 12/14/2021), Disp: , Rfl:  ? ?Current Facility-Administered Medications:  ?  0.9 %   sodium chloride infusion, 500 mL, Intravenous, Once, Mansouraty, Telford Nab., MD ?Allergies  ?Allergen Reactions  ? Bee Venom Anaphylaxis  ? ?Family History  ?Problem Relation Age of Onset  ? Colon polyps Mother   ? Colon cancer Cousin   ? ?Social History  ? ?Socioeconomic History  ? Marital status: Married  ?  Spouse name: Not on file  ? Number of children: Not on file  ? Years of education: Not on file  ? Highest education level: Not on file  ?Occupational History  ? Not on file  ?Tobacco Use  ? Smoking status: Former  ?  Types: E-cigarettes  ? Smokeless tobacco: Not on file  ?Vaping Use  ? Vaping Use: Every day  ? Substances: Nicotine  ?Substance and Sexual Activity  ? Alcohol use: Yes  ?  Alcohol/week: 12.0 standard drinks  ?  Types: 12 Standard drinks or equivalent per week  ? Drug use: No  ? Sexual activity: Not on file  ?Other Topics Concern  ? Not on file  ?Social History Narrative  ? Not on file  ? ?Social Determinants of Health  ? ?Financial Resource Strain: Not on file  ?Food Insecurity: Not on file  ?Transportation Needs: Not on file  ?Physical Activity: Not on file  ?Stress: Not on file  ?Social Connections: Not on file  ?Intimate Partner Violence: Not on file  ? ? ?Physical Exam: ?Today's Vitals  ? 01/04/22 0848 01/04/22  8588 01/04/22 0930 01/04/22 0935  ?BP:  127/85 127/85 116/76  ?Pulse:  90 91 91  ?Resp:  '12 16 12  '$ ?Temp: 98 ?F (36.7 ?C)     ?TempSrc:      ?SpO2:  100% 100% 100%  ?Weight:      ?Height:      ? ?Body mass index is 27.76 kg/m?. ?GEN: NAD ?EYE: Sclerae anicteric ?ENT: MMM ?CV: Non-tachycardic ?GI: Soft, NT/ND ?NEURO:  Alert & Oriented x 3 ? ?Lab Results: ?No results for input(s): WBC, HGB, HCT, PLT in the last 72 hours. ?BMET ?No results for input(s): NA, K, CL, CO2, GLUCOSE, BUN, CREATININE, CALCIUM in the last 72 hours. ?LFT ?No results for input(s): PROT, ALBUMIN, AST, ALT, ALKPHOS, BILITOT, BILIDIR, IBILI in the last 72 hours. ?PT/INR ?No results for input(s): LABPROT, INR in the  last 72 hours. ? ? ?Impression / Plan: ?This is a 46 y.o.male who presents for Colonoscopy for screening. ? ?The risks and benefits of endoscopic evaluation/treatment were discussed with the patient and/or family; these include but are not limited to the risk of perforation, infection, bleeding, missed lesions, lack of diagnosis, severe illness requiring hospitalization, as well as anesthesia and sedation related illnesses.  The patient's history has been reviewed, patient examined, no change in status, and deemed stable for procedure.  The patient and/or family is agreeable to proceed.  ? ? ?Justice Britain, MD ?[facility] ?Advanced Endoscopy ?Office # [phone] ? ?

## 2022-01-04 NOTE — Progress Notes (Signed)
Report given to PACU, vss 

## 2022-01-04 NOTE — Op Note (Signed)
[place] ?Patient Name: Raymond Hernandez ?Procedure Date: 01/04/2022 9:30 AM ?MRN: [id number] ?Endoscopist: Justice Britain , MD ?Age: 46 ?Referring MD:  ?Date of Birth: November 30, 1975 ?Gender: Male ?Account #: [id number] ?Procedure:                Colonoscopy ?Indications:              Screening for colorectal malignant neoplasm, This  ?                          is the patient's first colonoscopy ?Medicines:                Monitored Anesthesia Care ?Procedure:                Pre-Anesthesia Assessment: ?                          - Prior to the procedure, a History and Physical  ?                          was performed, and patient medications and  ?                          allergies were reviewed. The patient's tolerance of  ?                          previous anesthesia was also reviewed. The risks  ?                          and benefits of the procedure and the sedation  ?                          options and risks were discussed with the patient.  ?                          All questions were answered, and informed consent  ?                          was obtained. Prior Anticoagulants: The patient has  ?                          taken no previous anticoagulant or antiplatelet  ?                          agents. ASA Grade Assessment: II - A patient with  ?                          mild systemic disease. After reviewing the risks  ?                          and benefits, the patient was deemed in  ?                          satisfactory condition to undergo the procedure. ?  After obtaining informed consent, the colonoscope  ?                          was passed under direct vision. Throughout the  ?                          procedure, the patient's blood pressure, pulse, and  ?                          oxygen saturations were monitored continuously. The  ?                          CF [id number] #[id number] was introduced through the anus  ?                          and advanced to the 5  cm into the ileum. The  ?                          colonoscopy was performed without difficulty. The  ?                          patient tolerated the procedure. The quality of the  ?                          bowel preparation was good. The terminal ileum,  ?                          ileocecal valve, appendiceal orifice, and rectum  ?                          were photographed. ?Scope In: 9:42:51 AM ?Scope Out: 9:56:11 AM ?Scope Withdrawal Time: 0 hours 10 minutes 25 seconds  ?Total Procedure Duration: 0 hours 13 minutes 20 seconds  ?Findings:                 The digital rectal exam findings include  ?                          hemorrhoids. Pertinent negatives include no  ?                          palpable rectal lesions. ?                          The terminal ileum and ileocecal valve appeared  ?                          normal. ?                          Two sessile polyps were found in the rectum and  ?                          transverse colon. The polyps were 2 to 4 mm in  ?  size. These polyps were removed with a cold snare.  ?                          Resection and retrieval were complete. ?                          Normal mucosa was found in the entire colon  ?                          otherwise. ?                          Non-bleeding non-thrombosed external and internal  ?                          hemorrhoids were found during retroflexion, during  ?                          perianal exam and during digital exam. The  ?                          hemorrhoids were Grade II (internal hemorrhoids  ?                          that prolapse but reduce spontaneously). ?Complications:            No immediate complications. ?Estimated Blood Loss:     Estimated blood loss was minimal. ?Impression:               - Hemorrhoids found on digital rectal exam. ?                          - The examined portion of the ileum was normal. ?                          - Two 2 to 4 mm polyps in the rectum  and in the  ?                          transverse colon, removed with a cold snare.  ?                          Resected and retrieved. ?                          - Normal mucosa in the entire examined colon  ?                          otherwise. ?                          - Non-bleeding non-thrombosed external and internal  ?                          hemorrhoids. ?Recommendation:           - The patient will be observed post-procedure,  ?  until all discharge criteria are met. ?                          - Discharge patient to home. ?                          - Patient has a contact number available for  ?                          emergencies. The signs and symptoms of potential  ?                          delayed complications were discussed with the  ?                          patient. Return to normal activities tomorrow.  ?                          Written discharge instructions were provided to the  ?                          patient. ?                          - High fiber diet. ?                          - Use FiberCon 1-2 tablets PO daily. ?                          - Continue present medications. ?                          - Await pathology results. ?                          - Repeat colonoscopy in 5-10 years for surveillance  ?                          based on pathology results and findings of  ?                          adenomatous tissue. ?                          - The findings and recommendations were discussed  ?                          with the patient. ?                          - The findings and recommendations were discussed  ?                          with the patient's family. ?Justice Britain, MD ?01/04/2022 10:01:48 AM ?

## 2022-01-06 ENCOUNTER — Telehealth: Payer: Self-pay

## 2022-01-06 NOTE — Telephone Encounter (Signed)
?  Follow up Call- ? ? ?  01/04/2022  ?  8:48 AM  ?Call back number  ?Post procedure Call Back phone  # [phone]  ?Permission to leave phone message Yes  ?  ? ?Patient questions: ? ?Do you have a fever, pain , or abdominal swelling? No. ?Pain Score  0 * ? ?Have you tolerated food without any problems? Yes.   ? ?Have you been able to return to your normal activities? Yes.   ? ?Do you have any questions about your discharge instructions: ?Diet   No. ?Medications  No. ?Follow up visit  No. ? ?Do you have questions or concerns about your Care? No. ? ?Actions: ?* If pain score is 4 or above: ?No action needed, pain <4. ? ? ?

## 2022-01-10 ENCOUNTER — Encounter: Payer: Self-pay | Admitting: Gastroenterology

## 2024-09-16 ENCOUNTER — Other Ambulatory Visit: Payer: Self-pay | Admitting: Internal Medicine

## 2024-09-16 DIAGNOSIS — E785 Hyperlipidemia, unspecified: Secondary | ICD-10-CM

## 2024-10-20 ENCOUNTER — Other Ambulatory Visit

## 2024-10-31 ENCOUNTER — Other Ambulatory Visit
